# Patient Record
Sex: Female | Born: 1995 | Hispanic: Yes | Marital: Single | Smoking: Never smoker
Health system: Southern US, Community
[De-identification: ages and names within clinical notes are randomized; demographics above are authoritative.]

## PROBLEM LIST (undated history)

## (undated) DIAGNOSIS — I10 Essential (primary) hypertension: Secondary | ICD-10-CM

## (undated) DIAGNOSIS — J45909 Unspecified asthma, uncomplicated: Secondary | ICD-10-CM

## (undated) HISTORY — DX: Morbid (severe) obesity due to excess calories: E66.01

## (undated) HISTORY — PX: NO PAST SURGERIES: SHX2092

## (undated) HISTORY — DX: Unspecified asthma, uncomplicated: J45.909

## (undated) HISTORY — DX: Essential (primary) hypertension: I10

---

## 2014-02-25 NOTE — L&D Delivery Note (Cosign Needed)
Delivery Note At 11:42 AM a viable female was delivered via Vaginal, Spontaneous Delivery (Presentation: ; Occiput Anterior).  APGAR: 9, 9; weight 7 lb 15.7 oz (3620 g).   Placenta status: Intact, Spontaneous.  Cord: 3 vessels with the following complications: Short.  Cord pH: not obtained  Anesthesia: Epidural  Episiotomy: None Lacerations: 1st degree Suture Repair: 3.0 vicryl Est. Blood Loss (mL): 100  Mom to postpartum.  Baby to Couplet care / Skin to Skin.  Cherrie Gauze Wouk 10/15/2014, 1:59 PM

## 2014-03-10 ENCOUNTER — Other Ambulatory Visit (HOSPITAL_COMMUNITY): Payer: Self-pay | Admitting: Nurse Practitioner

## 2014-03-10 DIAGNOSIS — Z3689 Encounter for other specified antenatal screening: Secondary | ICD-10-CM

## 2014-03-10 LAB — OB RESULTS CONSOLE RPR: RPR: NONREACTIVE

## 2014-03-10 LAB — OB RESULTS CONSOLE ABO/RH: RH Type: POSITIVE

## 2014-03-10 LAB — OB RESULTS CONSOLE RUBELLA ANTIBODY, IGM: RUBELLA: IMMUNE

## 2014-03-10 LAB — OB RESULTS CONSOLE GC/CHLAMYDIA
Chlamydia: NEGATIVE
Gonorrhea: NEGATIVE

## 2014-03-10 LAB — OB RESULTS CONSOLE ANTIBODY SCREEN: Antibody Screen: NEGATIVE

## 2014-03-10 LAB — OB RESULTS CONSOLE HEPATITIS B SURFACE ANTIGEN: HEP B S AG: NEGATIVE

## 2014-03-10 LAB — OB RESULTS CONSOLE HIV ANTIBODY (ROUTINE TESTING): HIV: NONREACTIVE

## 2014-03-16 ENCOUNTER — Ambulatory Visit (HOSPITAL_COMMUNITY)
Admission: RE | Admit: 2014-03-16 | Discharge: 2014-03-16 | Disposition: A | Discharge status: Home / Self Care | Payer: Self-pay | Source: Ambulatory Visit | Attending: Nurse Practitioner | Admitting: Nurse Practitioner

## 2014-03-16 ENCOUNTER — Other Ambulatory Visit (HOSPITAL_COMMUNITY): Payer: Self-pay | Admitting: Nurse Practitioner

## 2014-03-16 ENCOUNTER — Encounter (HOSPITAL_COMMUNITY): Payer: Self-pay

## 2014-03-16 ENCOUNTER — Ambulatory Visit (HOSPITAL_COMMUNITY): Admission: RE | Admit: 2014-03-16 | Payer: Self-pay | Source: Ambulatory Visit

## 2014-03-16 DIAGNOSIS — Z3689 Encounter for other specified antenatal screening: Secondary | ICD-10-CM

## 2014-03-16 DIAGNOSIS — Z36 Encounter for antenatal screening of mother: Secondary | ICD-10-CM | POA: Insufficient documentation

## 2014-03-16 DIAGNOSIS — O208 Other hemorrhage in early pregnancy: Secondary | ICD-10-CM | POA: Insufficient documentation

## 2014-03-16 DIAGNOSIS — Z3A10 10 weeks gestation of pregnancy: Secondary | ICD-10-CM | POA: Insufficient documentation

## 2014-06-09 ENCOUNTER — Other Ambulatory Visit (HOSPITAL_COMMUNITY): Payer: Self-pay | Admitting: Urology

## 2014-06-09 DIAGNOSIS — Z36 Encounter for antenatal screening for chromosomal anomalies: Secondary | ICD-10-CM

## 2014-06-12 ENCOUNTER — Other Ambulatory Visit (HOSPITAL_COMMUNITY): Payer: Self-pay | Admitting: Urology

## 2014-06-12 DIAGNOSIS — Z3689 Encounter for other specified antenatal screening: Secondary | ICD-10-CM

## 2014-06-13 ENCOUNTER — Ambulatory Visit (HOSPITAL_COMMUNITY)
Admission: RE | Admit: 2014-06-13 | Discharge: 2014-06-13 | Disposition: A | Discharge status: Home / Self Care | Payer: Self-pay | Source: Ambulatory Visit | Attending: Urology | Admitting: Urology

## 2014-06-13 ENCOUNTER — Other Ambulatory Visit (HOSPITAL_COMMUNITY): Payer: Self-pay | Admitting: Urology

## 2014-06-13 DIAGNOSIS — Z3689 Encounter for other specified antenatal screening: Secondary | ICD-10-CM

## 2014-06-13 DIAGNOSIS — O99212 Obesity complicating pregnancy, second trimester: Secondary | ICD-10-CM | POA: Insufficient documentation

## 2014-06-13 DIAGNOSIS — Z36 Encounter for antenatal screening of mother: Secondary | ICD-10-CM | POA: Insufficient documentation

## 2014-06-13 DIAGNOSIS — Z3A22 22 weeks gestation of pregnancy: Secondary | ICD-10-CM | POA: Insufficient documentation

## 2014-06-16 ENCOUNTER — Emergency Department (HOSPITAL_COMMUNITY)
Admission: EM | Admit: 2014-06-16 | Discharge: 2014-06-16 | Disposition: A | Discharge status: Home / Self Care | Payer: Self-pay | Attending: Emergency Medicine | Admitting: Emergency Medicine

## 2014-06-16 ENCOUNTER — Encounter (HOSPITAL_COMMUNITY): Payer: Self-pay | Admitting: Emergency Medicine

## 2014-06-16 DIAGNOSIS — Z043 Encounter for examination and observation following other accident: Secondary | ICD-10-CM | POA: Insufficient documentation

## 2014-06-16 DIAGNOSIS — E669 Obesity, unspecified: Secondary | ICD-10-CM | POA: Insufficient documentation

## 2014-06-16 DIAGNOSIS — O9989 Other specified diseases and conditions complicating pregnancy, childbirth and the puerperium: Secondary | ICD-10-CM | POA: Insufficient documentation

## 2014-06-16 DIAGNOSIS — Y9389 Activity, other specified: Secondary | ICD-10-CM | POA: Insufficient documentation

## 2014-06-16 DIAGNOSIS — Z3A23 23 weeks gestation of pregnancy: Secondary | ICD-10-CM | POA: Insufficient documentation

## 2014-06-16 DIAGNOSIS — O99212 Obesity complicating pregnancy, second trimester: Secondary | ICD-10-CM | POA: Insufficient documentation

## 2014-06-16 DIAGNOSIS — W1839XA Other fall on same level, initial encounter: Secondary | ICD-10-CM | POA: Insufficient documentation

## 2014-06-16 DIAGNOSIS — R55 Syncope and collapse: Secondary | ICD-10-CM | POA: Insufficient documentation

## 2014-06-16 DIAGNOSIS — Y998 Other external cause status: Secondary | ICD-10-CM | POA: Insufficient documentation

## 2014-06-16 DIAGNOSIS — Z79899 Other long term (current) drug therapy: Secondary | ICD-10-CM | POA: Insufficient documentation

## 2014-06-16 LAB — CBC WITH DIFFERENTIAL/PLATELET
Basophils Absolute: 0 10*3/uL (ref 0.0–0.1)
Basophils Relative: 0 % (ref 0–1)
EOS PCT: 1 % (ref 0–5)
Eosinophils Absolute: 0.1 10*3/uL (ref 0.0–0.7)
HCT: 34.1 % — ABNORMAL LOW (ref 36.0–46.0)
Hemoglobin: 11.5 g/dL — ABNORMAL LOW (ref 12.0–15.0)
LYMPHS ABS: 1.4 10*3/uL (ref 0.7–4.0)
Lymphocytes Relative: 14 % (ref 12–46)
MCH: 28.3 pg (ref 26.0–34.0)
MCHC: 33.7 g/dL (ref 30.0–36.0)
MCV: 84 fL (ref 78.0–100.0)
Monocytes Absolute: 0.6 10*3/uL (ref 0.1–1.0)
Monocytes Relative: 6 % (ref 3–12)
NEUTROS PCT: 79 % — AB (ref 43–77)
Neutro Abs: 7.6 10*3/uL (ref 1.7–7.7)
Platelets: 215 10*3/uL (ref 150–400)
RBC: 4.06 MIL/uL (ref 3.87–5.11)
RDW: 13.6 % (ref 11.5–15.5)
WBC: 9.7 10*3/uL (ref 4.0–10.5)

## 2014-06-16 LAB — BASIC METABOLIC PANEL
Anion gap: 9 (ref 5–15)
BUN: 6 mg/dL (ref 6–23)
CALCIUM: 9.2 mg/dL (ref 8.4–10.5)
CO2: 23 mmol/L (ref 19–32)
Chloride: 107 mmol/L (ref 96–112)
Creatinine, Ser: 0.62 mg/dL (ref 0.50–1.10)
GFR calc Af Amer: >90 mL/min (ref >90)
GFR calc non Af Amer: >90 mL/min (ref >90)
Glucose, Bld: 87 mg/dL (ref 70–99)
POTASSIUM: 3.7 mmol/L (ref 3.5–5.1)
SODIUM: 139 mmol/L (ref 135–145)

## 2014-06-16 LAB — CBG MONITORING, ED: Glucose-Capillary: 85 mg/dL (ref 70–99)

## 2014-06-16 NOTE — Discharge Instructions (Signed)
Presncope (Near-Syncope) El presncope (comnmente llamado "casi desmayo") es un estado de debilidad repentina, Research scientist (life sciences)mareos o sensacin de que la persona va a Baristadesmayarse. Durante un episodio de presncope, tambin puede tener la piel plida, visin en tnel o ganas de vomitar (nuseas). El presncope puede ocurrir al levantarse de una silla o al Personal assistantpermanecer de pie durante Chase Citymucho tiempo. La causa es una disminucin sbita del flujo de sangre al cerebro. Esta disminucin puede ser el resultado de varias causas o disparadores, la Churubuscomayora de las cuales no son graves. Sin embargo, debido a que a Multimedia programmerveces el presncope puede ser un signo de una afeccin grave, es necesaria una evaluacin mdica. Generalmente la causa especfica no puede determinarse. INSTRUCCIONES PARA EL CUIDADO EN EL HOGAR  Controle su afeccin para ver si hay cambios. Las siguientes indicaciones ayudarn a Psychologist, educationalaliviar cualquier Longs Drug Storesmolestia que pueda sentir:  Pdale a alguien que se quede con usted hasta que se sienta estable.  Recustese inmediatamente y VF Corporationeleve las piernas si siente que va a desmayarse. Respire profundamente y de Williamsfieldmanera continua. Espere hasta que los sntomas hayan desaparecido. La mayor parte de los episodios dura unos pocos minutos. Podr sentirse cansado por varias horas.  Beba suficiente lquido para Photographermantener la orina clara o de color amarillo plido.  Si toma medicamentos para la presin arterial o para el corazn, levntese lentamente si est sentado o acostado. Tmese algunos minutos para permanecer sentado y luego prese. Esto puede reducir Microsoftlos mareos.  Concurra a las consultas de control con su mdico segn las indicaciones. SOLICITE ATENCIN MDICA DE INMEDIATO SI:   Sufre un dolor intenso de Turkmenistancabeza.  Siente un dolor intenso inusual en el pecho, el abdomen o la espalda.  Tiene un sangrado por la boca o el recto, o la materia fecal es de color negro o aspecto alquitranado.  Siente latidos irregulares o muy  rpidos.  Sufre episodios de Baxter Internationaldesmayo repetidos o temblores como sacudidas durante un episodio.  Se desmaya mientras se encuentra sentado o acostado.  Se siente confundido.  Tiene problemas para caminar.  Siente debilidad intensa.  Tiene problemas de visin. ASEGRESE DE QUE:   Comprende estas instrucciones.  Controlar su afeccin.  Recibir ayuda de inmediato si no mejora o si empeora. Document Released: 02/11/2005 Document Revised: 02/16/2013 [facility] Patient Information 2015 [place]manExitCare, MarylandLLC. This information is not intended to replace advice given to you by your health care provider. Make sure you discuss any questions you have with your health care provider.  Sncope vasovagal - Adultos (Vasovagal Syncope, Adult) El sncope, comnmente conocido como Shady Shoresdesmayo, es una prdida transitoria de la conciencia. Se produce cuando se reduce el flujo sanguneo al cerebro. El sncope vasovagal (tambin llamado sncope neurocardiognico) es un desmayo en el que el flujo de sangre al cerebro se reduce a causa de una cada repentina en la frecuencia cardaca y la presin arterial. El sncope vasovagal se produce cuando el cerebro y el sistema cardiovascular (vasos sanguneos) no se comunican ni responden el uno al otro de Greenvalemanera adecuada. Esta es la causa ms comn de Worthingtondesmayos. A menudo se produce como respuesta al miedo o algn otro tipo de estrs emocional o fsico. El cuerpo tiene una reaccin en la que el corazn comienza a latir muy lentamente o los vasos sanguneos se expanden, para reducir la presin arterial. Este tipo de desmayo se considera generalmente inofensivo. Sin embargo, pueden ocurrir lesiones si la persona tiene una cada repentina durante el Coulee Damdesmayo.  CAUSAS  El sncope vasovagal se produce cuando la  presin arterial y la frecuencia cardaca de una persona disminuyen de repente, por lo general en respuesta a un factor desencadenante. Muchas cosas y situaciones pueden desencadenar  un episodio. Entre ellas se incluyen:   Dolor.   Temor.   Ver sangre o procedimientos mdicos, como la sangre que se extrae de una vena.   Las [employer], tales como toser, Financial controller, o ir al bao.   Estrs emocional.   Permanecer mucho tiempo de pie, especialmente en un ambiente clido.   La falta de sueo o de descanso.   Prolongada falta de alimentos.   Prolongada falta de lquidos.   Enfermedad reciente.  El uso de ciertas drogas que afectan la presin arterial, como la cocana, el alcohol, la marihuana, los inhalantes y los opiceos.  SNTOMAS  Antes del episodio de desmayo, es posible que:   Se sienta mareado o dbil.   Se vuelve plido.  Sienta que se va a desmayar.   Siente como si la habitacin diera vueltas.   Tiene una visin de tnel, slo ve lo que hay directamente frente a usted.   Siente malestar estomacal (nuseas).   Ve manchas o pierde poco a poco la visin.   Escucha un zumbido en los odos.   Tiene dolor de [place].  Se siente afiebrado y sudoroso.   Tiene sensacin de hinchazn u hormigueo. Durante el desmayo, por lo general, estar inconsciente durante no ms de un par de minutos antes de despertar y volver a la normalidad. Si usted se levanta demasiado rpido antes de que su cuerpo pueda recuperarse, se va a desmayar otra vez. Durante el desmayo pueden producirse movimientos espasmdicos o bruscos.  DIAGNSTICO  El mdico le preguntar United Stationers sntomas y le har una historia clnica y un examen fsico. Se pueden hacer varios estudios para Sales promotion account executive otras causas de los [place]. Estos pueden incluir anlisis de [place] y pruebas para revisar el corazn, como un Midwife, y posiblemente, un estudio de Actor. Cuando otras causas han sido descartadas, se puede Education officer, environmental un examen para comprobar la respuesta del organismo a los cambios de posicin (prueba de la mesa basculante).   TRATAMIENTO  La mayora de los casos de sncope vasovagal no requieren TEFL teacher. Su mdico le puede Financial planner de Automotive engineer los desencadenantes de [place] y puede proporcionarle estrategias caseras para prevenir desmayos. Si tiene que estar expuesto a un posible desencadenante, puede beber lquidos adicionales para ayudar a reducir sus probabilidades de sufrir un episodio de sncope vasovagal. Si hay signos de advertencia de un episodio que se acerca, usted puede responder posicionndose favorablemente (acostarse).  Si los [employer], se le pueden dar medicamentos para evitarlos. Algunos medicamentos pueden ayudar a hacerlo ms resistente a episodios repetidos de sncope vasovagal. Se pueden recomendar ejercicios especiales o medias de compresin. En raros casos, se considera la colocacin quirrgica de un marcapasos.  INSTRUCCIONES PARA EL CUIDADO DOMICILIARIO   Aprenda a identificar las seales del sncope vasovagal.   Sintese o acustese a la primera seal de un desmayo. Si se sienta, ponga su cabeza [employer]. Si usted se [place], coloque las piernas [place] para aumentar el flujo de sangre al cerebro.   Evite los baos calientes y los saunas.  Evite estar mucho tiempo de pie.  Debe ingerir gran cantidad de lquido para mantener la orina de tono claro o color amarillo plido. Evite la cafena.  Aumente la sal en su dieta siguiendo las indicaciones de su mdico.   Si tiene que estar  parado durante mucho tiempo, realice movimientos tales como:   Psychologist, counselling las piernas.   Flexionar y Furniture conservator/restorer los msculos de las piernas.   Ponerse en cuclillas.   Mover las piernas.   Doblarse.   Tome slo medicamentos de venta libre o recetados, segn las indicaciones del mdico. No deje de tomar ningn medicamento sin consultar con su mdico primero. SOLICITE ATENCIN MDICA SI:   Sus desmayos continan o suceden con mayor frecuencia a pesar del tratamiento.    Pierde el conocimiento durante ms de un par de minutos.  Se ha desmayado durante o despus de hacer ejercicio o si se sorprende.   Usted tiene nuevos sntomas que se producen con los [place], tales como:   Falta de [place].  Dolor en el pecho.   Latidos cardacos irregulares.   Usted tiene episodios de espasmos o movimientos bruscos que duran ms de unos pocos segundos.  Usted tiene episodios de espasmos o movimientos bruscos sin desmayos. BUSQUE ATENCIN MDICA DE INMEDIATO SI:   Usted tiene lesiones o hemorragia despus de un desmayo.   Usted tiene episodios de espasmos o movimientos bruscos que duran ms de 5 minutos.   Usted tiene ms de un episodio de movimientos espasmdicos antes de Copy la consciencia despus de un desmayo. ASEGRESE DE QUE:   Comprende estas instrucciones.  Controlar su enfermedad.  Solicitar ayuda de inmediato si no mejora o si empeora. Document Released: 06/08/2012 [facility] Patient Information 2015 [place], [place]. This information is not intended to replace advice given to you by your health care provider. Make sure you discuss any questions you have with your health care provider. Please follow-up with your doctor for further evaluation and management of your symptoms. There is not appear to be an emergent cause for your symptoms to experience today. Return to ED for new or worsening symptoms.

## 2014-06-16 NOTE — ED Provider Notes (Signed)
CSN: [id number]     Arrival date & time 06/16/14  1121 History   First MD Initiated Contact with Patient 06/16/14 1130     Chief Complaint  Patient presents with  . Loss of Consciousness     (Consider location/radiation/quality/duration/timing/severity/associated sxs/prior Treatment) HPI Sabrina Riley is a 19 y.o. female who is currently [redacted] weeks pregnant who comes in for evaluation of her fall. Patient is Spanish-speaking and uses family at the bedside to interpret. Family reports patient was visiting another patient in the ICU when she became hot, felt dizzy and fainted falling forward. Family reports that she was standing beside patient when this occurred, patient appeared to have lost consciousness "for a second" but did not hit her head. Denies any headache, vision changes, chest pain, short of breath, abdominal pain, pelvic pain, vaginal bleeding or discharge. Patient denies any discomfort at this time. Rapid OB at bedside.  History reviewed. No pertinent past medical history. History reviewed. No pertinent past surgical history. History reviewed. No pertinent family history. History  Substance Use Topics  . Smoking status: Never Smoker   . Smokeless tobacco: Never Used  . Alcohol Use: No   OB History    Gravida Para Term Preterm AB TAB SAB Ectopic Multiple Living   1              Review of Systems A 10 point review of systems was completed and was negative except for pertinent positives and negatives as mentioned in the history of present illness     Allergies  Review of patient's allergies indicates no known allergies.  Home Medications   Prior to Admission medications   Medication Sig Start Date End Date Taking? Authorizing Provider  ibuprofen (ADVIL,MOTRIN) 200 MG tablet Take 200 mg by mouth every 6 (six) hours as needed for headache.   Yes Historical Provider, MD  Prenatal Vit-Fe Fumarate-FA (PRENATAL MULTIVITAMIN) TABS tablet Take 1 tablet by mouth daily at  12 noon.   Yes Historical Provider, MD   BP 125/68 mmHg  Pulse 84  Temp(Src) 98.5 F (36.9 C) (Oral)  Resp 21  Ht 5\' 6"  (1.676 m)  Wt 286 lb (129.729 kg)  BMI 46.18 kg/m2  SpO2 99% Physical Exam  Constitutional: She is oriented to person, place, and time. She appears well-developed and well-nourished.  Obese  HENT:  Head: Normocephalic and atraumatic.  Mouth/Throat: Oropharynx is clear and moist.  Eyes: Conjunctivae are normal. Pupils are equal, round, and reactive to light. Right eye exhibits no discharge. Left eye exhibits no discharge. No scleral icterus.  Neck: Neck supple.  Cardiovascular: Normal rate, regular rhythm and normal heart sounds.   Pulmonary/Chest: Effort normal and breath sounds normal. No respiratory distress. She has no wheezes. She has no rales.  Abdominal: Soft. There is no tenderness.  Musculoskeletal: She exhibits no tenderness.  Neurological: She is alert and oriented to person, place, and time.  Cranial Nerves II-XII grossly intact. Moves all extremities without ataxia. Baseline mental status. Sensation intact. Gait Baseline  Skin: Skin is warm and dry. No rash noted.  Psychiatric: She has a normal mood and affect.  Nursing note and vitals reviewed.   ED Course  Procedures (including critical care time) Labs Review Labs Reviewed  CBC WITH DIFFERENTIAL/PLATELET - Abnormal; Notable for the following:    Hemoglobin 11.5 (*)    HCT 34.1 (*)    Neutrophils Relative % 79 (*)    All other components within normal limits  BASIC METABOLIC PANEL  CBG  MONITORING, ED    Imaging Review No results found.   EKG Interpretation   Date/Time:  Thursday June 16 2014 12:41:14 EDT Ventricular Rate:  72 PR Interval:  124 QRS Duration: 94 QT Interval:  395 QTC Calculation: 432 R Axis:   60 Text Interpretation:  Sinus rhythm Baseline wander in lead(s) I Confirmed  by HARRISON  MD, FORREST ([id number]) on 06/16/2014 12:43:50 PM      Meds given in  ED:  Medications - No data to display  Discharge Medication List as of 06/16/2014  2:54 PM     Filed Vitals:   06/16/14 1245 06/16/14 1345 06/16/14 1415 06/16/14 1458  BP: 116/62 128/59 131/85 125/68  Pulse: 70 73 115 84  Temp:    98.5 F (36.9 C)  TempSrc:    Oral  Resp: Height:      Weight:      SpO2: 99% 100% 99% 99%    MDM  Vitals stable - WNL -afebrile Pt resting comfortably in ED. PE--grossly benign physical exam. Patient ambulates throughout the ED without difficulty. Labwork--labs are noncontributory.  DDX--per OB note, patient is cleared from obstetric standpoint. Patient is also clear from medical standpoint. No evidence of acute or emergent pathology. Doubt ICH, SAH or other intracranial pathology. Reported syncopal event does not appear to be cardiac in nature. [place] syncope negative. Discussed further follow-up with primary care for further evaluation and management of symptoms. Discussed further the need for appropriate rehydration therapies.  I discussed all relevant lab findings and imaging results with pt and they verbalized understanding. Discussed f/u with PCP within 48 hrs and return precautions, pt very amenable to plan. Prior to patient discharge, I discussed and reviewed this case with Dr. Romeo Apple   Final diagnoses:  Syncope, non cardiac        Joycie Peek, PA-C 06/16/14 2108  Purvis Sheffield, MD 06/16/14 2114

## 2014-06-16 NOTE — Progress Notes (Signed)
1140 Arrived to evaluate this 19 yo G1P0 @ 23.[redacted] wks GA in with report of syncopal episode with fall.  No trauma noted.  No abdominal pain, vaginal bleeding or leaking of fluid.  Reports good fetal movement.  1150  Bedside US by EDP to determine presentation and locate fetal heart to assist with EFM placement.  FHR WNL.  1230 Dr. Adriano Bischof [facility] notified of patient in ED and of complaint.  Notified of recent US with dating.  Notified of FHR WNL without report of UC's.  With no indication of abdominal trauma patient is OB cleared by Dr. Mahagony Grieb FullingHarraway-Smith.

## 2014-06-16 NOTE — ED Notes (Signed)
Pt coming in from visiting pt in the ICU. Pt had an unwitnessed fall. Pt reports that she became hot and dizzy. Pt reports that when she fell she caught herself with her hands. Pt is [redacted] weeks pregnant. Rapid Ab nurse called. Pt denies all pain.

## 2014-09-21 LAB — OB RESULTS CONSOLE GBS: GBS: POSITIVE

## 2014-10-12 ENCOUNTER — Other Ambulatory Visit (HOSPITAL_COMMUNITY): Payer: Self-pay | Admitting: Urology

## 2014-10-12 ENCOUNTER — Ambulatory Visit (HOSPITAL_COMMUNITY)
Admission: RE | Admit: 2014-10-12 | Discharge: 2014-10-12 | Disposition: A | Discharge status: Home / Self Care | Payer: Self-pay | Source: Ambulatory Visit | Attending: Physician Assistant | Admitting: Physician Assistant

## 2014-10-12 DIAGNOSIS — IMO0001 Reserved for inherently not codable concepts without codable children: Secondary | ICD-10-CM

## 2014-10-12 DIAGNOSIS — Z3403 Encounter for supervision of normal first pregnancy, third trimester: Secondary | ICD-10-CM

## 2014-10-12 DIAGNOSIS — O480 Post-term pregnancy: Secondary | ICD-10-CM

## 2014-10-13 ENCOUNTER — Encounter (HOSPITAL_COMMUNITY): Payer: Self-pay | Admitting: *Deleted

## 2014-10-13 ENCOUNTER — Telehealth (HOSPITAL_COMMUNITY): Payer: Self-pay | Admitting: *Deleted

## 2014-10-13 NOTE — Telephone Encounter (Signed)
Preadmission screen Interpreter number [id number] 

## 2014-10-14 ENCOUNTER — Inpatient Hospital Stay (HOSPITAL_COMMUNITY)
Admission: AD | Admit: 2014-10-14 | Discharge: 2014-10-17 | DRG: 775 | Disposition: A | Discharge status: Home / Self Care | Payer: Medicaid Other | Source: Ambulatory Visit | Attending: Family Medicine | Admitting: Family Medicine

## 2014-10-14 ENCOUNTER — Encounter (HOSPITAL_COMMUNITY): Payer: Self-pay | Admitting: *Deleted

## 2014-10-14 DIAGNOSIS — Z8759 Personal history of other complications of pregnancy, childbirth and the puerperium: Secondary | ICD-10-CM

## 2014-10-14 DIAGNOSIS — Z3A40 40 weeks gestation of pregnancy: Secondary | ICD-10-CM | POA: Diagnosis present

## 2014-10-14 DIAGNOSIS — O133 Gestational [pregnancy-induced] hypertension without significant proteinuria, third trimester: Secondary | ICD-10-CM

## 2014-10-14 DIAGNOSIS — O99212 Obesity complicating pregnancy, second trimester: Secondary | ICD-10-CM | POA: Diagnosis present

## 2014-10-14 DIAGNOSIS — O99824 Streptococcus B carrier state complicating childbirth: Secondary | ICD-10-CM | POA: Diagnosis present

## 2014-10-14 DIAGNOSIS — O480 Post-term pregnancy: Secondary | ICD-10-CM | POA: Diagnosis present

## 2014-10-14 DIAGNOSIS — O99214 Obesity complicating childbirth: Secondary | ICD-10-CM | POA: Diagnosis present

## 2014-10-14 DIAGNOSIS — Z6841 Body Mass Index (BMI) 40.0 and over, adult: Secondary | ICD-10-CM | POA: Diagnosis not present

## 2014-10-14 DIAGNOSIS — Z3490 Encounter for supervision of normal pregnancy, unspecified, unspecified trimester: Secondary | ICD-10-CM

## 2014-10-14 HISTORY — DX: Personal history of other complications of pregnancy, childbirth and the puerperium: Z87.59

## 2014-10-14 LAB — PROTEIN / CREATININE RATIO, URINE: Creatinine, Urine: 67 mg/dL

## 2014-10-14 LAB — CBC
HEMATOCRIT: 38.1 % (ref 36.0–46.0)
HEMOGLOBIN: 13.4 g/dL (ref 12.0–15.0)
MCH: 29.5 pg (ref 26.0–34.0)
MCHC: 35.2 g/dL (ref 30.0–36.0)
MCV: 83.7 fL (ref 78.0–100.0)
Platelets: 192 10*3/uL (ref 150–400)
RBC: 4.55 MIL/uL (ref 3.87–5.11)
RDW: 14.4 % (ref 11.5–15.5)
WBC: 9.2 10*3/uL (ref 4.0–10.5)

## 2014-10-14 LAB — TYPE AND SCREEN
ABO/RH(D): O POS
Antibody Screen: NEGATIVE

## 2014-10-14 LAB — URINE MICROSCOPIC-ADD ON

## 2014-10-14 LAB — URINALYSIS, ROUTINE W REFLEX MICROSCOPIC
Bilirubin Urine: NEGATIVE
Glucose, UA: NEGATIVE mg/dL
Hgb urine dipstick: NEGATIVE
Ketones, ur: NEGATIVE mg/dL
Nitrite: NEGATIVE
Protein, ur: NEGATIVE mg/dL
Specific Gravity, Urine: 1.01 (ref 1.005–1.030)
Urobilinogen, UA: 0.2 mg/dL (ref 0.0–1.0)
pH: 5.5 (ref 5.0–8.0)

## 2014-10-14 LAB — COMPREHENSIVE METABOLIC PANEL WITH GFR
ALT: 21 U/L (ref 14–54)
AST: 24 U/L (ref 15–41)
Albumin: 2.9 g/dL — ABNORMAL LOW (ref 3.5–5.0)
Alkaline Phosphatase: 138 U/L — ABNORMAL HIGH (ref 38–126)
Anion gap: 11 (ref 5–15)
BUN: 7 mg/dL (ref 6–20)
CO2: 20 mmol/L — ABNORMAL LOW (ref 22–32)
Calcium: 9.3 mg/dL (ref 8.9–10.3)
Chloride: 106 mmol/L (ref 101–111)
Creatinine, Ser: 0.55 mg/dL (ref 0.44–1.00)
GFR calc Af Amer: >60 mL/min
GFR calc non Af Amer: >60 mL/min
Glucose, Bld: 77 mg/dL (ref 65–99)
Potassium: 4 mmol/L (ref 3.5–5.1)
Sodium: 137 mmol/L (ref 135–145)
Total Bilirubin: 0.5 mg/dL (ref 0.3–1.2)
Total Protein: 6.2 g/dL — ABNORMAL LOW (ref 6.5–8.1)

## 2014-10-14 LAB — ABO/RH: ABO/RH(D): O POS

## 2014-10-14 MED ORDER — MISOPROSTOL 25 MCG QUARTER TABLET
25.0000 ug | ORAL_TABLET | ORAL | Status: DC | PRN
  Administered 2014-10-14 (×2): 25 ug via VAGINAL
  Filled 2014-10-14 (×2): qty 0.25

## 2014-10-14 MED ORDER — OXYCODONE-ACETAMINOPHEN 5-325 MG PO TABS: 1.0000 | ORAL_TABLET | ORAL | Status: DC | PRN

## 2014-10-14 MED ORDER — LIDOCAINE HCL (PF) 1 % IJ SOLN
30.0000 mL | INTRAMUSCULAR | Status: DC | PRN
  Filled 2014-10-14: qty 30

## 2014-10-14 MED ORDER — ONDANSETRON HCL 4 MG/2ML IJ SOLN: 4.0000 mg | Freq: Four times a day (QID) | INTRAMUSCULAR | Status: DC | PRN

## 2014-10-14 MED ORDER — PENICILLIN G POTASSIUM 5000000 UNITS IJ SOLR
5.0000 10*6.[IU] | Freq: Once | INTRAVENOUS | Status: AC
  Administered 2014-10-14: 5 10*6.[IU] via INTRAVENOUS
  Filled 2014-10-14: qty 5

## 2014-10-14 MED ORDER — CITRIC ACID-SODIUM CITRATE 334-500 MG/5ML PO SOLN: 30.0000 mL | ORAL | Status: DC | PRN

## 2014-10-14 MED ORDER — LACTATED RINGERS IV SOLN: 500.0000 mL | INTRAVENOUS | Status: DC | PRN

## 2014-10-14 MED ORDER — PENICILLIN G POTASSIUM 5000000 UNITS IJ SOLR
2.5000 10*6.[IU] | INTRAVENOUS | Status: DC
  Administered 2014-10-15 (×2): 2.5 10*6.[IU] via INTRAVENOUS
  Filled 2014-10-14 (×7): qty 2.5

## 2014-10-14 MED ORDER — LACTATED RINGERS IV SOLN
INTRAVENOUS | Status: DC
  Administered 2014-10-14: 125 mL/h via INTRAVENOUS
  Administered 2014-10-14 – 2014-10-15 (×2): via INTRAVENOUS

## 2014-10-14 MED ORDER — PENICILLIN G POTASSIUM 5000000 UNITS IJ SOLR
5.0000 10*6.[IU] | Freq: Once | INTRAVENOUS | Status: DC
  Filled 2014-10-14: qty 5

## 2014-10-14 MED ORDER — FENTANYL CITRATE (PF) 100 MCG/2ML IJ SOLN
50.0000 ug | INTRAMUSCULAR | Status: DC | PRN
  Administered 2014-10-15 (×3): 100 ug via INTRAVENOUS
  Administered 2014-10-15: 50 ug via INTRAVENOUS
  Filled 2014-10-14 (×4): qty 2

## 2014-10-14 MED ORDER — PENICILLIN G POTASSIUM 5000000 UNITS IJ SOLR
2.5000 10*6.[IU] | INTRAVENOUS | Status: DC
  Filled 2014-10-14 (×4): qty 2.5

## 2014-10-14 MED ORDER — OXYTOCIN 40 UNITS IN LACTATED RINGERS INFUSION - SIMPLE MED
1.0000 m[IU]/min | INTRAVENOUS | Status: DC
  Administered 2014-10-14: 2 m[IU]/min via INTRAVENOUS
  Filled 2014-10-14 (×2): qty 1000

## 2014-10-14 MED ORDER — OXYCODONE-ACETAMINOPHEN 5-325 MG PO TABS: 2.0000 | ORAL_TABLET | ORAL | Status: DC | PRN

## 2014-10-14 MED ORDER — ACETAMINOPHEN 325 MG PO TABS: 650.0000 mg | ORAL_TABLET | ORAL | Status: DC | PRN

## 2014-10-14 MED ORDER — TERBUTALINE SULFATE 1 MG/ML IJ SOLN: 0.2500 mg | Freq: Once | INTRAMUSCULAR | Status: DC | PRN

## 2014-10-14 NOTE — MAU Note (Signed)
Pt sent from [facility] for HTN eval. BP 150/80 in office today. Spanish interpreter needed.

## 2014-10-14 NOTE — Progress Notes (Signed)
Labor Progress Note  S: Feeling well. No complaints.   O:  BP 131/63 mmHg  Pulse 68  Temp(Src) 98.1 F (36.7 C) (Axillary)  Resp 16  Ht 5' 2.25" (1.581 m)  Wt 303 lb 4 oz (137.553 kg)  BMI 55.03 kg/m2  Dilation: 1.5 Effacement (%): 70 Cervical Position: Posterior Station: -3 Presentation: Vertex Exam by:: Dr.Layanna Charo  A&P: 19 y.o. G1P0 [redacted]w[redacted]d for IOL 2/2 to gHTN #Labor: Induction. S/p Cytotec #2 (placed dose#2 now), FB placed #FWB: CAT I #GBS POS- PCN in labor or with ROM  Federico Flake, MD 6:37 PM

## 2014-10-14 NOTE — Plan of Care (Signed)
Pt. Urine in lab 

## 2014-10-14 NOTE — Progress Notes (Signed)
Labor Progress Note  Sabrina Riley is a 19 y.o. G1P0 at [redacted]w[redacted]d  admitted for induction of labor due to Gestational Hypertension.  S: Patient doing well and comfortable. No concerns voiced.  O:  BP 114/98 mmHg  Pulse 72  Temp(Src) 99 F (37.2 C) (Oral)  Resp 16  Ht 5' 2.25" (1.581 m)  Wt 303 lb 4 oz (137.553 kg)  BMI 55.03 kg/m2  FHT:  FHR: 140 bpm, variability: moderate,  accelerations:  Present,  decelerations:  Absent UC:   regular, every 3-6 minutes SVE:   Dilation: 4 Effacement (%): 90 Station: -3 Exam by:: CNM Leftwich-kirby  Labs: Lab Results  Component Value Date   WBC 9.2 10/14/2014   HGB 13.4 10/14/2014   HCT 38.1 10/14/2014   MCV 83.7 10/14/2014   PLT 192 10/14/2014    Assessment / Plan: 19 y.o. G1P0 [redacted]w[redacted]d  in early labor Induction of labor due to gestational hypertension  Labor: Will start pitocin; FB now out.  Fetal Wellbeing:  Category I Pain Control:  IV pain medications upon maternal request; she declined epidural Anticipated MOD:  NSVD  Expectant management   Caryl Ada, DO 10/14/2014, 11:29 PM PGY-2, [facility]

## 2014-10-14 NOTE — H&P (Signed)
OBSTETRIC ADMISSION HISTORY AND PHYSICAL  Sabrina Riley is a 19 y.o. female G1P0 with IUP at [redacted]w[redacted]d by 10 week [place] presenting for elevated BP at term.  She has been seen in clinic with two blood pressures > 140/90 more than 6 hours apartment. Previous CMP, CBC were wnl per review of records.   She reports +FMs, No LOF, no VB, no blurry vision, headaches or peripheral edema, and RUQ pain.    She plans on breastfeeding. She requests IUD for birth control.  Dating: By Ines Bloomer --->  Estimated Date of Delivery: 10/12/14  Sono:   @[redacted]w[redacted]d  BPP, Cephalic presentation. AFI =14.36 cm, BPP 8/8 @22w6 , Anatomy Scan. 535g, 53rd%, anterior placenta  Prenatal History/Complications: Past Medical History: Past Medical History  Diagnosis Date  . Hypertension   . Asthma   . Morbid obesity     Past Surgical History: Past Surgical History  Procedure Laterality Date  . No past surgeries      Obstetrical History: OB History    Gravida Para Term Preterm AB TAB SAB Ectopic Multiple Living   1               Social History: Social History   Social History  . Marital Status: Single    Spouse Name: N/A  . Number of Children: N/A  . Years of Education: N/A   Social History Main Topics  . Smoking status: Never Smoker   . Smokeless tobacco: Never Used  . Alcohol Use: No  . Drug Use: No  . Sexual Activity: Yes   Other Topics Concern  . None   Social History Narrative    Family History: Family History  Problem Relation Age of Onset  . Heart disease Father   . Diabetes Father   . Thyroid disease Sister     Allergies: No Known Allergies  Prescriptions prior to admission  Medication Sig Dispense Refill Last Dose  . Prenatal Vit-Fe Fumarate-FA (PRENATAL MULTIVITAMIN) TABS tablet Take 1 tablet by mouth daily at 12 noon.   10/14/2014 at Unknown time     Review of Systems   All systems reviewed and negative except as stated in HPI  Blood pressure 120/71, pulse 74, temperature 98.3  F (36.8 C), temperature source Oral, resp. rate 18, height 5' 2.25" (1.581 m), weight 303 lb 4 oz (137.553 kg). General appearance: alert, cooperative and morbidly obese Lungs: clear to auscultation bilaterally Heart: regular rate and rhythm Abdomen: soft, non-tender; bowel sounds normal Pelvic: adequate Extremities: Homans sign is negative, no sign of DVT Presentation: cephalic Fetal monitoringBaseline: 135 bpm/ Mod/ +Accels, no decels Uterine activity: None Dilation: 1 Effacement (%): 80 Station: -3 Exam by:: Tiffinie Caillier MD   Prenatal labs: ABO, Rh: O/Positive/-- (01/14 0000) Antibody: Negative (01/14 0000) Rubella:  IMMUNE (03/10/2014) RPR: Nonreactive (01/14 0000)  HBsAg: Negative (01/14 0000)  HIV: Non-reactive (01/14 0000)  GBS: Positive (07/27 0000)  1 hr Glucola  107 Genetic screening  Neg Anatomy US - wnl  Prenatal Transfer Tool  Maternal Diabetes: No Genetic Screening: Normal Maternal Ultrasounds/Referrals: Normal Fetal Ultrasounds or other Referrals:  None Maternal Substance Abuse:  No Significant Maternal Medications:  None Significant Maternal Lab Results: Lab values include: Group B Strep positive  Results for orders placed or performed during the hospital encounter of 10/14/14 (from the past 24 hour(s))  Protein / creatinine ratio, urine   Collection Time: 10/14/14 10:42 AM  Result Value Ref Range   Creatinine, Urine 67.00 mg/dL   Total Protein, Urine PENDING mg/dL  Protein Creatinine Ratio PENDING 0.00 - 0.15 mg/mg[Cre]    Patient Active Problem List   Diagnosis Date Noted  . Term pregnancy 10/14/2014  . Gestational HTN 10/14/2014  . Obesity affecting pregnancy in second trimester, antepartum     Assessment: Sabrina Riley is a 19 y.o. G1P0 at [redacted]w[redacted]d here for elevated BP that necessitates IOL given meeting criteria for gHTN at term.   #Labor: Induction 2/2 to gHTN at term.  Plan for cytotec and FB placement. Pit when 4cm.  #Pain: Does not  want epidural. Would like medication in IV #FWB: Cat I  #ID: GBS POS- PCN with active labor or ROM #MOF: Breast #MOC:IUD  #gHTN vs PreX:  Will get labs to help clarify diagnosis but regardless necessitates IOL.   Sabrina Riley [facility] 10/14/2014, 12:32 PM

## 2014-10-14 NOTE — Progress Notes (Signed)
I stopped to check on patient's need I ordered some snacks, by Orlan Leavens Spanish Interpreter.

## 2014-10-15 ENCOUNTER — Inpatient Hospital Stay (HOSPITAL_COMMUNITY): Payer: Medicaid Other | Admitting: Anesthesiology

## 2014-10-15 ENCOUNTER — Encounter (HOSPITAL_COMMUNITY): Payer: Self-pay | Admitting: *Deleted

## 2014-10-15 DIAGNOSIS — O480 Post-term pregnancy: Secondary | ICD-10-CM

## 2014-10-15 DIAGNOSIS — Z3A40 40 weeks gestation of pregnancy: Secondary | ICD-10-CM

## 2014-10-15 DIAGNOSIS — O99824 Streptococcus B carrier state complicating childbirth: Secondary | ICD-10-CM

## 2014-10-15 DIAGNOSIS — Z6841 Body Mass Index (BMI) 40.0 and over, adult: Secondary | ICD-10-CM

## 2014-10-15 DIAGNOSIS — O99214 Obesity complicating childbirth: Secondary | ICD-10-CM

## 2014-10-15 DIAGNOSIS — O133 Gestational [pregnancy-induced] hypertension without significant proteinuria, third trimester: Secondary | ICD-10-CM

## 2014-10-15 LAB — CBC
HCT: 39.5 % (ref 36.0–46.0)
HEMATOCRIT: 38.1 % (ref 36.0–46.0)
HEMOGLOBIN: 13.9 g/dL (ref 12.0–15.0)
Hemoglobin: 13.3 g/dL (ref 12.0–15.0)
MCH: 29.5 pg (ref 26.0–34.0)
MCH: 29.3 pg (ref 26.0–34.0)
MCHC: 35.2 g/dL (ref 30.0–36.0)
MCHC: 34.9 g/dL (ref 30.0–36.0)
MCV: 83.9 fL (ref 78.0–100.0)
MCV: 83.9 fL (ref 78.0–100.0)
Platelets: 192 10*3/uL (ref 150–400)
Platelets: 192 10*3/uL (ref 150–400)
RBC: 4.71 MIL/uL (ref 3.87–5.11)
RBC: 4.54 MIL/uL (ref 3.87–5.11)
RDW: 14.2 % (ref 11.5–15.5)
RDW: 14.1 % (ref 11.5–15.5)
WBC: 13.1 10*3/uL — AB (ref 4.0–10.5)
WBC: 16.1 10*3/uL — AB (ref 4.0–10.5)

## 2014-10-15 LAB — RPR: RPR: NONREACTIVE

## 2014-10-15 MED ORDER — DIPHENHYDRAMINE HCL 50 MG/ML IJ SOLN: 12.5000 mg | INTRAMUSCULAR | Status: DC | PRN

## 2014-10-15 MED ORDER — PHENYLEPHRINE 40 MCG/ML (10ML) SYRINGE FOR IV PUSH (FOR BLOOD PRESSURE SUPPORT)
80.0000 ug | PREFILLED_SYRINGE | INTRAVENOUS | Status: DC | PRN
  Filled 2014-10-15: qty 20

## 2014-10-15 MED ORDER — LANOLIN HYDROUS EX OINT: TOPICAL_OINTMENT | CUTANEOUS | Status: DC | PRN

## 2014-10-15 MED ORDER — ZOLPIDEM TARTRATE 5 MG PO TABS: 5.0000 mg | ORAL_TABLET | Freq: Every evening | ORAL | Status: DC | PRN

## 2014-10-15 MED ORDER — WITCH HAZEL-GLYCERIN EX PADS: 1.0000 "application " | MEDICATED_PAD | CUTANEOUS | Status: DC | PRN

## 2014-10-15 MED ORDER — FENTANYL 2.5 MCG/ML BUPIVACAINE 1/10 % EPIDURAL INFUSION (WH - ANES): 14.0000 mL/h | INTRAMUSCULAR | Status: DC | PRN

## 2014-10-15 MED ORDER — ONDANSETRON HCL 4 MG/2ML IJ SOLN: 4.0000 mg | INTRAMUSCULAR | Status: DC | PRN

## 2014-10-15 MED ORDER — DIPHENHYDRAMINE HCL 25 MG PO CAPS: 25.0000 mg | ORAL_CAPSULE | Freq: Four times a day (QID) | ORAL | Status: DC | PRN

## 2014-10-15 MED ORDER — EPHEDRINE 5 MG/ML INJ: 10.0000 mg | INTRAVENOUS | Status: DC | PRN

## 2014-10-15 MED ORDER — ACETAMINOPHEN 325 MG PO TABS: 650.0000 mg | ORAL_TABLET | ORAL | Status: DC | PRN

## 2014-10-15 MED ORDER — LIDOCAINE HCL (PF) 1 % IJ SOLN
INTRAMUSCULAR | Status: DC | PRN
  Administered 2014-10-15 (×2): 5 mL

## 2014-10-15 MED ORDER — SENNOSIDES-DOCUSATE SODIUM 8.6-50 MG PO TABS
2.0000 | ORAL_TABLET | ORAL | Status: DC
  Administered 2014-10-15 – 2014-10-16 (×2): 2 via ORAL
  Filled 2014-10-15 (×2): qty 2

## 2014-10-15 MED ORDER — FENTANYL 2.5 MCG/ML BUPIVACAINE 1/10 % EPIDURAL INFUSION (WH - ANES)
14.0000 mL/h | INTRAMUSCULAR | Status: DC | PRN
  Filled 2014-10-15: qty 125

## 2014-10-15 MED ORDER — SIMETHICONE 80 MG PO CHEW: 80.0000 mg | CHEWABLE_TABLET | ORAL | Status: DC | PRN

## 2014-10-15 MED ORDER — BENZOCAINE-MENTHOL 20-0.5 % EX AERO
1.0000 "application " | INHALATION_SPRAY | CUTANEOUS | Status: DC | PRN
  Filled 2014-10-15: qty 56

## 2014-10-15 MED ORDER — ONDANSETRON HCL 4 MG PO TABS: 4.0000 mg | ORAL_TABLET | ORAL | Status: DC | PRN

## 2014-10-15 MED ORDER — FENTANYL 2.5 MCG/ML BUPIVACAINE 1/10 % EPIDURAL INFUSION (WH - ANES)
INTRAMUSCULAR | Status: DC | PRN
  Administered 2014-10-15: 14 mL/h via EPIDURAL

## 2014-10-15 MED ORDER — TETANUS-DIPHTH-ACELL PERTUSSIS 5-2.5-18.5 LF-MCG/0.5 IM SUSP
0.5000 mL | Freq: Once | INTRAMUSCULAR | Status: DC
  Filled 2014-10-15: qty 0.5

## 2014-10-15 MED ORDER — OXYTOCIN 40 UNITS IN LACTATED RINGERS INFUSION - SIMPLE MED: 62.5000 mL/h | INTRAVENOUS | Status: DC | PRN

## 2014-10-15 MED ORDER — DIBUCAINE 1 % RE OINT
1.0000 "application " | TOPICAL_OINTMENT | RECTAL | Status: DC | PRN
  Filled 2014-10-15: qty 28

## 2014-10-15 MED ORDER — MEASLES, MUMPS & RUBELLA VAC ~~LOC~~ INJ
0.5000 mL | INJECTION | Freq: Once | SUBCUTANEOUS | Status: DC
  Filled 2014-10-15: qty 0.5

## 2014-10-15 MED ORDER — PRENATAL MULTIVITAMIN CH
1.0000 | ORAL_TABLET | Freq: Every day | ORAL | Status: DC
  Administered 2014-10-16 – 2014-10-17 (×2): 1 via ORAL
  Filled 2014-10-15 (×2): qty 1

## 2014-10-15 NOTE — Progress Notes (Signed)
Assisted RN with interpretation of patient admit to unit.  Spanish Interpreter

## 2014-10-15 NOTE — Progress Notes (Signed)
Checked on patients needs.  °Spanish Interpreter  °

## 2014-10-15 NOTE — Anesthesia Procedure Notes (Signed)
Epidural Patient location during procedure: OB Start time: 10/15/2014 6:14 AM End time: 10/15/2014 6:33 AM  Staffing Anesthesiologist: Sebastian Ache  Preanesthetic Checklist Completed: patient identified, site marked, surgical consent, pre-op evaluation, timeout performed, IV checked, risks and benefits discussed and monitors and equipment checked  Epidural Patient position: sitting Prep: site prepped and draped and DuraPrep Patient monitoring: heart rate, continuous pulse ox and blood pressure Approach: midline Location: L3-L4 Injection technique: LOR air  Needle:  Needle type: Tuohy  Needle gauge: 17 G Needle length: 9 cm and 9 Needle insertion depth: 8.5 cm Catheter type: closed end flexible Catheter size: 20 Guage Catheter at skin depth: 15 cm Test dose: negative  Assessment Events: blood not aspirated, injection not painful, no injection resistance, negative IV test and no paresthesia  Additional Notes   Patient tolerated the insertion well without complications.Reason for block:procedure for pain

## 2014-10-15 NOTE — Progress Notes (Signed)
Sabrina Riley is a 19 y.o. G1P0 at [redacted]w[redacted]d admitted for induction of labor due to [facility].  Subjective: Pt breathing with contractions, requesting IV pain medication.  Pt does not desire epidural for pain.  Family in room for support.  Objective: BP 128/68 mmHg  Pulse 64  Temp(Src) 98.6 F (37 C) (Oral)  Resp 20  Ht 5' 2.25" (1.581 m)  Wt 137.553 kg (303 lb 4 oz)  BMI 55.03 kg/m2      FHT:  FHR: 145 bpm, variability: moderate,  accelerations:  Present,  decelerations:  Absent UC:   Not tracing on toco, difficult to palpate r/t body habitus.  Pt breathing with contractions every 3-4 minutes. SVE:   Dilation: 5 Effacement (%): 90 Station: +1 Exam by:: Larose Kells RN  Labs: Lab Results  Component Value Date   WBC 9.2 10/14/2014   HGB 13.4 10/14/2014   HCT 38.1 10/14/2014   MCV 83.7 10/14/2014   PLT 192 10/14/2014    Assessment / Plan: Induction of labor due to gestational hypertension,  progressing well on pitocin  Labor: Progressing normally.  Plan to check cervix in 1 hour.  If no progress, AROM and IUPC.   Preeclampsia:  n/a Fetal Wellbeing:  Category I Pain Control:  Fentanyl I/D:  n/a Anticipated MOD:  NSVD  LEFTWICH-KIRBY, LISA 10/15/2014, 4:34 AM

## 2014-10-15 NOTE — Progress Notes (Signed)
Assited RN with interpretation of patient assessment.  Spanish Interpreter

## 2014-10-15 NOTE — Progress Notes (Signed)
Sabrina Riley is a 19 y.o. G1P0 at [redacted]w[redacted]d admitted for induction of labor due to [facility].  Subjective: Pt comfortable with epidural. Family in room for support.  Objective: BP 118/55 mmHg  Pulse 74  Temp(Src) 98.7 F (37.1 C) (Oral)  Resp 18  Ht 5' 2.25" (1.581 m)  Wt 137.553 kg (303 lb 4 oz)  BMI 55.03 kg/m2  SpO2 94%      FHT:  FHR: 135 bpm, variability: moderate,  accelerations:  Present,  decelerations:  Absent UC:   None on toco  SVE:   Dilation: 6 Effacement (%): 100 Station: 0, +1 Exam by:: L. Leftwich,CNM  AROM with clear fluid, IUPC and FSE placed.  Pt tolerated well.   Labs: Lab Results  Component Value Date   WBC 13.1* 10/15/2014   HGB 13.9 10/15/2014   HCT 39.5 10/15/2014   MCV 83.9 10/15/2014   PLT 192 10/15/2014    Assessment / Plan: Induction of labor due to gestational hypertension,  progressing well on pitocin  Labor: Progressing normally Preeclampsia:  n/a Fetal Wellbeing:  Category I Pain Control:  Epidural I/D:  n/a Anticipated MOD:  NSVD  LEFTWICH-KIRBY, Sabrina Riley 10/15/2014, 8:14 AM

## 2014-10-15 NOTE — Anesthesia Preprocedure Evaluation (Addendum)
Anesthesia Evaluation  Patient identified by MRN, date of birth, ID band Patient awake and Patient confused    Reviewed: Allergy & Precautions, H&P , NPO status , Patient's Chart, lab work & pertinent test results  Airway Mallampati: II       Dental   Pulmonary  breath sounds clear to auscultation  Pulmonary exam normal       Cardiovascular Exercise Tolerance: Good hypertension, On Medications Normal cardiovascular examRhythm:regular Rate:Normal     Neuro/Psych    GI/Hepatic   Endo/Other  Morbid obesityBMI 55  Renal/GU      Musculoskeletal   Abdominal (+) + obese,   Peds  Hematology   Anesthesia Other Findings   Reproductive/Obstetrics (+) Pregnancy                            Anesthesia Physical Anesthesia Plan  ASA: III  Anesthesia Plan: Epidural   Post-op Pain Management:    Induction:   Airway Management Planned:   Additional Equipment:   Intra-op Plan:   Post-operative Plan:   Informed Consent: I have reviewed the patients History and Physical, chart, labs and discussed the procedure including the risks, benefits and alternatives for the proposed anesthesia with the patient or authorized representative who has indicated his/her understanding and acceptance.     Plan Discussed with:   Anesthesia Plan Comments:         Anesthesia Quick Evaluation

## 2014-10-15 NOTE — Lactation Note (Signed)
This note was copied from the chart of Sabrina Tommie Lozano-Olvera. Lactation Consultation Note  Patient Name: Sabrina Riley ZOXWR'U Date: 10/15/2014 Reason for consult: Initial assessment Benita, the In-house Spanish interpreter present for visit. Mom reports she thinks baby is latching well, denies discomfort. Basic teaching reviewed with Mom. Encouraged to BF with feeding ques. Baby giving feeding ques at this visit but Mom declined to put baby to breast. Company present. Mom reports baby recently BF. Lactation brochure left for review, advised of OP services and support group. Will ask RN to review hand expression since company present at this visit. Encouraged Mom to call for assist or for questions/concerns.   Maternal Data Does the patient have breastfeeding experience prior to this delivery?: No  Feeding Feeding Type: Breast Fed  LATCH Score/Interventions Latch: Grasps breast easily, tongue down, lips flanged, rhythmical sucking.  Audible Swallowing: A few with stimulation  Type of Nipple: Everted at rest and after stimulation  Comfort (Breast/Nipple): Soft / non-tender     Hold (Positioning): No assistance needed to correctly position infant at breast.  LATCH Score: 9  Lactation Tools Discussed/Used     Consult Status Consult Status: Follow-up Date: 10/15/14 Follow-up type: In-patient    Alfred Levins 10/15/2014, 10:04 PM

## 2014-10-15 NOTE — Progress Notes (Signed)
Assisted RN from lactation with interpretation of breastfeeding instructions.  Spanish Interpreter

## 2014-10-16 MED ORDER — DOCUSATE SODIUM 100 MG PO CAPS: 100.0000 mg | ORAL_CAPSULE | Freq: Two times a day (BID) | ORAL | Status: DC | PRN

## 2014-10-16 NOTE — Anesthesia Postprocedure Evaluation (Signed)
Anesthesia Post Note  Patient: Sabrina Riley  Procedure(s) Performed: * No procedures listed *  Anesthesia type: Epidural  Patient location: Mother/Baby  Post pain: Pain level controlled  Post assessment: Post-op Vital signs reviewed  Last Vitals:  Filed Vitals:   10/16/14 0629  BP: 131/66  Pulse: 91  Temp: 36.4 C  Resp: 17    Post vital signs: Reviewed  Level of consciousness:alert  Complications: No apparent anesthesia complications

## 2014-10-16 NOTE — Progress Notes (Signed)
Checked on patients needs.  °Spanish Interpreter  °

## 2014-10-16 NOTE — Discharge Instructions (Signed)

## 2014-10-16 NOTE — Progress Notes (Signed)
Checked on patients needs.  Also assisted RN with interpretation of patient assessment.  °Spanish Interpreter  °

## 2014-10-16 NOTE — Discharge Summary (Signed)
Obstetric Discharge Summary Reason for Admission: induction of labor Prenatal Procedures: NST and ultrasound Intrapartum Procedures: spontaneous vaginal delivery Postpartum Procedures: none Complications-Operative and Postpartum: none  Delivery Note At 11:42 AM a viable female was delivered via Vaginal, Spontaneous Delivery (Presentation: ; Occiput Anterior). APGAR: 9, 9; weight 7 lb 15.7 oz (3620 g).  Placenta status: Intact, Spontaneous. Cord: 3 vessels with the following complications: Short. Cord pH: not obtained  Anesthesia: Epidural  Episiotomy: None Lacerations: 1st degree Suture Repair: 3.0 vicryl Est. Blood Loss (mL): 100  Hospital Course:  Active Problems:   Obesity affecting pregnancy in second trimester, antepartum   Term pregnancy   Gestational HTN   Sabrina Riley is a 19 y.o. G1P1001 s/p SVD.  Patient was admitted for IOL due to gHTN.  She has postpartum course that was uncomplicated including no problems with ambulating, PO intake, urination, pain, or bleeding. The pt feels ready to go home and  will be discharged with outpatient follow-up.   Today: No acute events overnight.  Pt denies problems with ambulating, voiding or po intake.  She denies nausea or vomiting.  Pain is well controlled.  She has had flatus. She has had bowel movement.  Lochia Small.  Plan for birth control is IUD.  Method of Feeding: Breast.  Physical Exam:  General: alert, cooperative and no distress Lochia: appropriate Uterine Fundus: firm DVT Evaluation: No evidence of DVT seen on physical exam.  H/H: Lab Results  Component Value Date/Time   HGB 13.3 10/15/2014 12:53 PM   HCT 38.1 10/15/2014 12:53 PM    Discharge Diagnoses: Term Pregnancy-delivered  Discharge Information: Date: 10/16/2014 Activity: pelvic rest Diet: routine  Medications: PNV and Colace Breast feeding:  Yes Condition: stable Instructions: refer to handout Discharge to: home     Medication List     TAKE these medications        docusate sodium 100 MG capsule  Commonly known as:  COLACE  Take 1 capsule (100 mg total) by mouth 2 (two) times daily as needed for mild constipation.     prenatal multivitamin Tabs tablet  Take 1 tablet by mouth daily at 12 noon.       Follow-up Information    Schedule an appointment as soon as possible for a visit with [facility] [place].   Why:  for postpartum follow-up   Contact information:   [address] [facility] [address] [phone]      Caryl Ada, DO 10/16/2014, 8:46 AM PGY-2, [facility]

## 2014-10-17 NOTE — Lactation Note (Signed)
This note was copied from the chart of Sabrina Shineka Lozano-Olvera. Lactation Consultation Note  Patient Name: Sabrina Riley ZOXWR'U Date: 10/17/2014 Reason for consult: Follow-up assessment  With this mom of a term baby, now 77 hours old. The baby has not passed a stool in 24 hours, so is not going home at this time. On exam of mom, she has extremely wide sset breasts, by normal appearing symetrical breasts, with easily expressed colostrum. Parents described the baby as 'gassy", but I explained she was hungry and cluster feeding, and tht mom's milk will come in in the next 24 hours hopefully, and then the baby will still feed with cues, but be more satisfied. i assisted mom with latching in cross cradle. The baby has an upper ip frenulum that extends o the gum line, and a tongue that may have some posterior frenulum restrictions. The baby did latch after a couple of attempts, and mom reports no discomfort, and baby is causing good breast movement. I gave mom comofrt gels and instructed her in their use, and a ahnd pump and also instructed her in it's use. Mom knows to call for questions/concerns.    Maternal Data    Feeding Feeding Type: Breast Fed Length of feed: 15 min  LATCH Score/Interventions Latch: Repeated attempts needed to sustain latch, nipple held in mouth throughout feeding, stimulation needed to elicit sucking reflex. Intervention(s): Adjust position;Assist with latch;Breast compression  Audible Swallowing: A few with stimulation  Type of Nipple: Everted at rest and after stimulation (extremely wide set breasts, equal in size with normal appearance and good nipples, eailsy expressed colostrum. )  Comfort (Breast/Nipple): Filling, red/small blisters or bruises, mild/mod discomfort  Problem noted: Mild/Moderate discomfort (nipple stripes on both breasts) Interventions (Mild/moderate discomfort): Comfort gels  Hold (Positioning): Assistance needed to correctly  position infant at breast and maintain latch. Intervention(s): Breastfeeding basics reviewed;Support Pillows;Position options;Skin to skin  LATCH Score: 6  Lactation Tools Discussed/Used     Consult Status Consult Status: Follow-up Date: 10/18/14 Follow-up type: In-patient    Alfred Levins 10/17/2014, 9:02 AM

## 2014-10-17 NOTE — Discharge Summary (Signed)
Obstetric Discharge Summary Reason for Admission: onset of labor Prenatal Procedures: ultrasound Intrapartum Procedures: spontaneous vaginal delivery Postpartum Procedures: none Complications-Operative and Postpartum: none HEMOGLOBIN  Date Value Ref Range Status  10/15/2014 13.3 12.0 - 15.0 g/dL Final   HCT  Date Value Ref Range Status  10/15/2014 38.1 36.0 - 46.0 % Final    Physical Exam:  General: alert, cooperative, appears stated age and no distress Lochia: appropriate Uterine Fundus: firm Incision: n/a DVT Evaluation: No evidence of DVT seen on physical exam. Negative Homan's sign. No cords or calf tenderness.  Discharge Diagnoses: Term Pregnancy-delivered  Discharge Information: Date: 10/17/2014 Activity: pelvic rest Diet: routine Medications: PNV and Ibuprofen Condition: stable and improved Instructions: refer to practice specific booklet Discharge to: home Follow-up Information    Schedule an appointment as soon as possible for a visit with [facility] [place].   Why:  for postpartum follow-up   Contact information:   [address] [place] [address] [phone]      Newborn Data: Live born female  Birth Weight: 7 lb 15.7 oz (3620 g) APGAR: 9, 9  Home with mother.  Sabrina Riley 10/17/2014, 5:43 AM

## 2014-10-20 ENCOUNTER — Inpatient Hospital Stay (HOSPITAL_COMMUNITY): Admission: RE | Admit: 2014-10-20 | Payer: Self-pay | Source: Ambulatory Visit

## 2016-08-11 IMAGING — US US OB DETAIL+14 WK
1 series · 12 of 28 positions shown · non-contrast
Comparison: none

[Series 1: us ob +14 all · 12 of 54 slices shown]
[im 2/54]
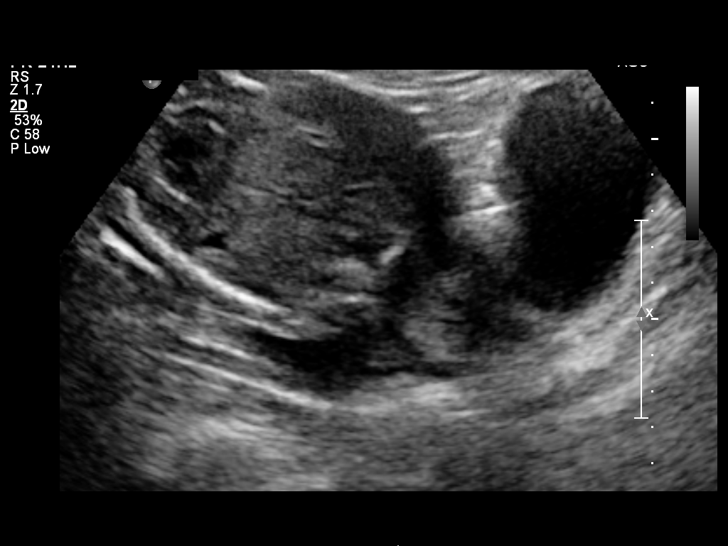
[im 6/54]
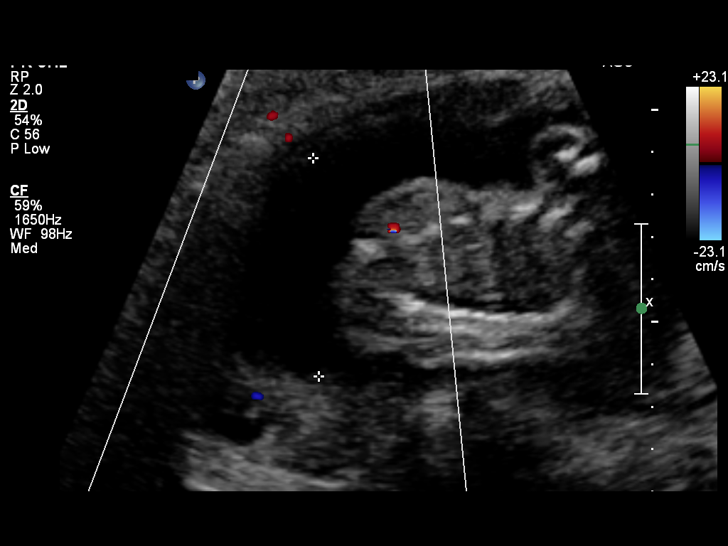
[im 10/54]
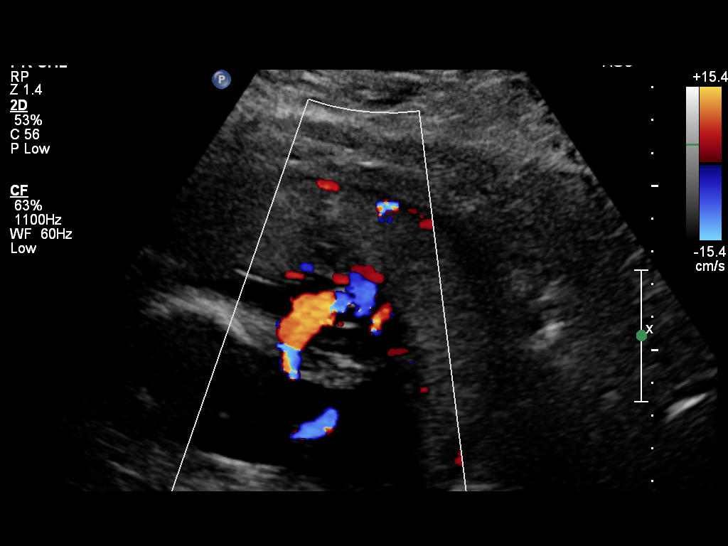
[im 16/54]
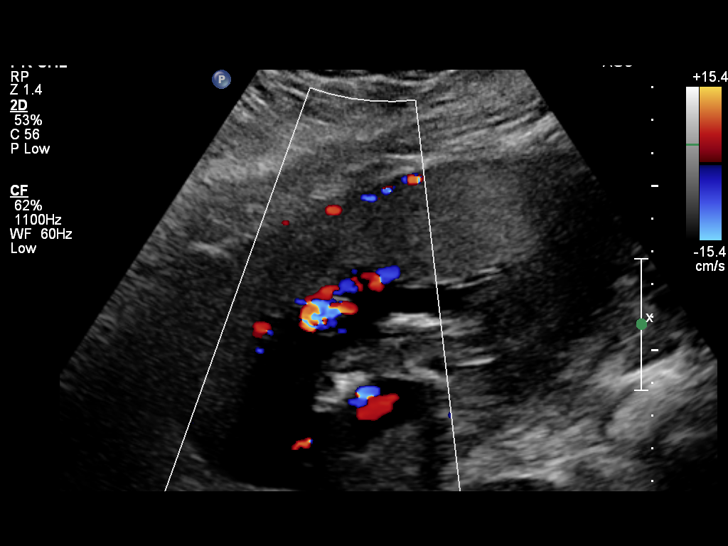
[im 20/54]
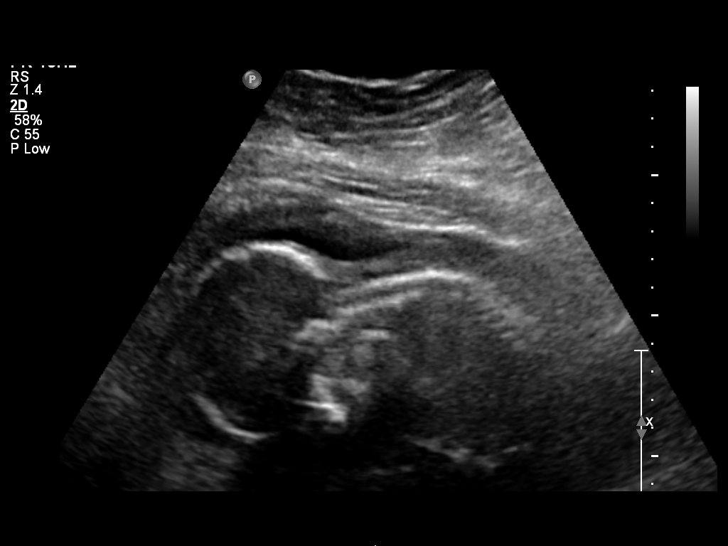
[im 24/54]
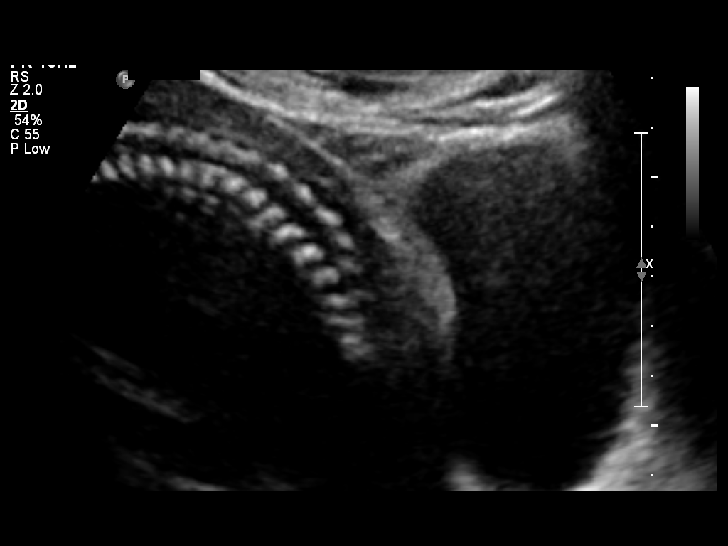
[im 30/54]
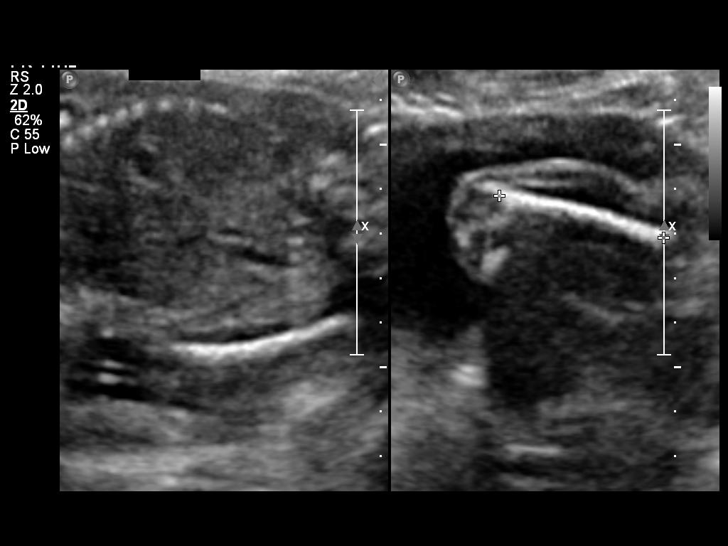
[im 34/54]
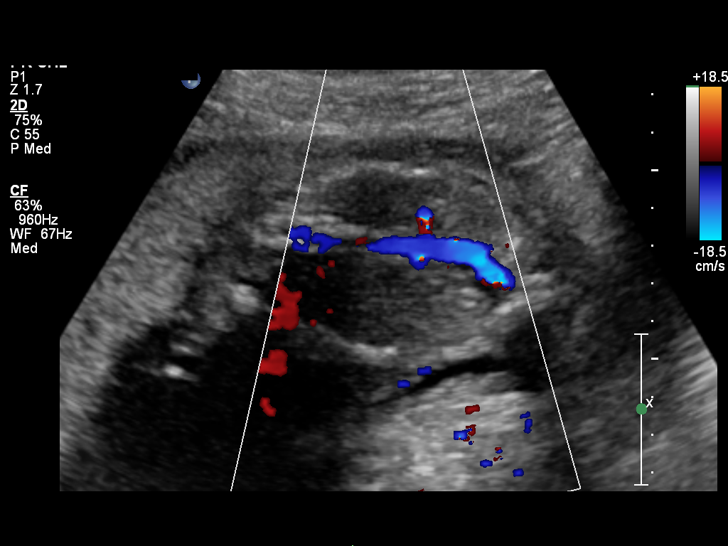
[im 38/54]
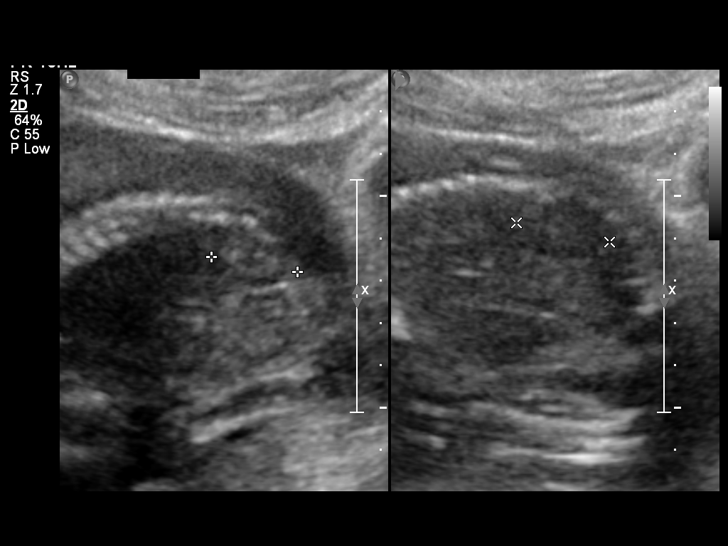
[im 44/54]
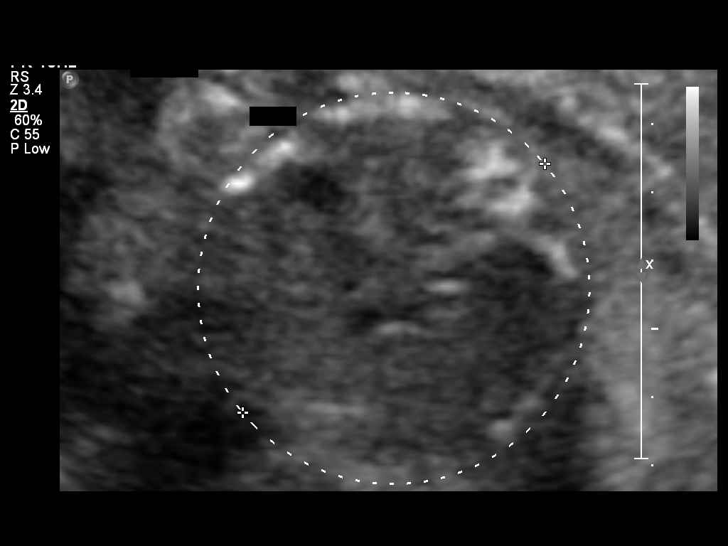
[im 48/54]
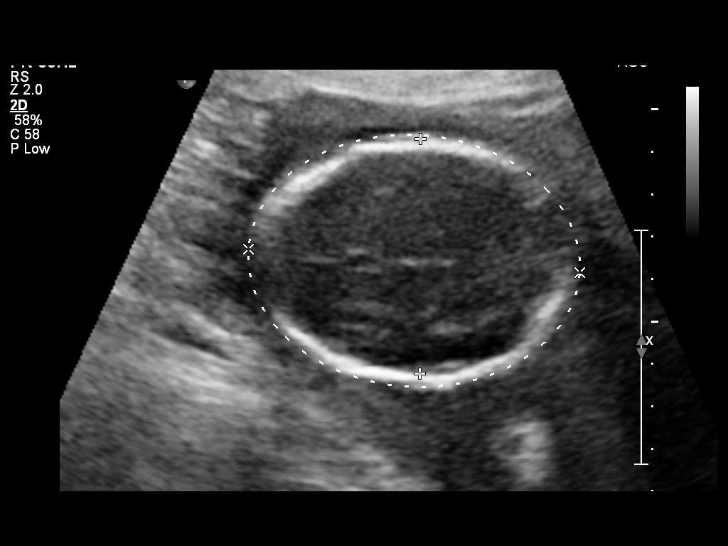
[im 52/54]
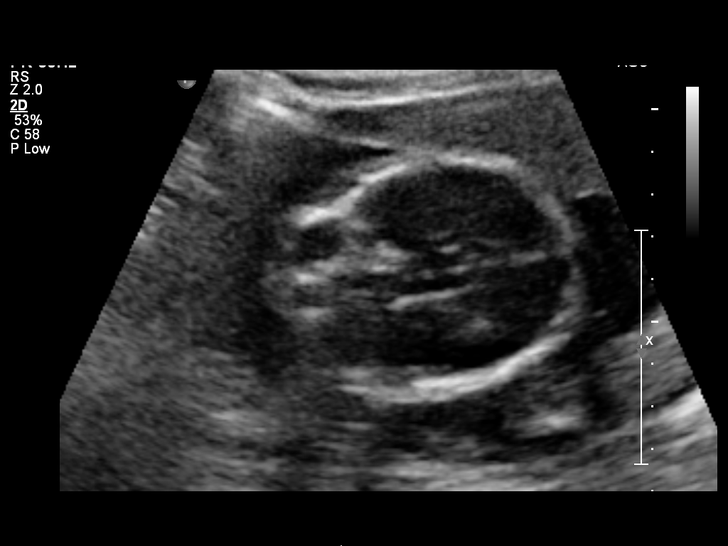

[12 of 28 positions shown; findings below may reference images not displayed]

OBSTETRICS REPORT
(Signed Final 06/13/2014 [DATE])

NAVI

Service(s) Provided

US OB DETAIL + 14 WK                                  76811.0
Indications

22 weeks gestation of pregnancy
Detailed fetal anatomic survey                        Z36
Obesity complicating pregnancy, second trimester
Fetal Evaluation

Num Of Fetuses:    1
Fetal Heart Rate:  160                          bpm
Cardiac Activity:  Observed
Presentation:      Breech
Placenta:          Anterior, above cervical os
P. Cord            Visualized, central
Insertion:

Amniotic Fluid
AFI FV:      Subjectively within normal limits
Larg Pckt:    5.12  cm
Biometry

BPD:     54.7  mm     G. Age:  22w 5d                CI:        66.67   70 - 86
FL/HC:      18.3   19.2 -
20.8
HC:     214.7  mm     G. Age:  23w 4d       69  %    HC/AC:      1.20   1.05 -
1.21
AC:       179  mm     G. Age:  22w 6d       44  %    FL/BPD:     71.8   71 - 87
FL:      39.3  mm     G. Age:  22w 4d       37  %    FL/AC:      22.0   20 - 24
HUM:     38.5  mm     G. Age:  23w 5d       65  %

Est. FW:     535  gm      1 lb 3 oz     53  %
Gestational Age

U/S Today:     22w 6d                                        EDD:   10/11/14
Best:          22w 5d     Det. By:  Early Ultrasound         EDD:   10/12/14
(03/16/14)
Anatomy
Cranium:          Appears normal         Aortic Arch:      Not well visualized
Fetal Cavum:      Not well visualized    Ductal Arch:      Not well visualized
Ventricles:       Appears normal         Diaphragm:        Not well visualized
Choroid Plexus:   Not well visualized    Stomach:          Appears normal, left
sided
Cerebellum:       Not well visualized    Abdomen:          Not well visualized
Posterior Fossa:  Not well visualized    Abdominal Wall:   Not well visualized
Nuchal Fold:      Not applicable (>20    Cord Vessels:     Appears normal (3
wks GA)                                  vessel cord)
Face:             Not well visualized    Kidneys:          Appear normal
Lips:             Not well visualized    Bladder:          Appears normal
Heart:            Not well visualized    Spine:            Limited views
appear normal
RVOT:             Not well visualized    Lower             Appears normal
Extremities:
LVOT:             Not well visualized    Upper             Appears normal
Extremities:

Other:  Technically difficult due to maternal habitus and fetal position.
Cervix Uterus Adnexa

Cervical Length:    3.3      cm

Cervix:       Normal appearance by transabdominal scan.
Uterus:       No abnormality visualized.
Cul De Sac:   No free fluid seen.
Left Ovary:    Not visualized.
Right Ovary:   Not visualized.
Adnexa:     No abnormality visualized.
Impression

Single IUP at 22w 5d
Limited views of the fetal anatomy obtained due to maternal
habitus and fetal position
No gross anomalies noted
Anterior placenta without previa
Normal amniotic fluid volume
Recommendations

Recommend follow-up ultrasound examination in 4 weeks to
reevaluate the fetal anatomy

## 2017-10-16 ENCOUNTER — Other Ambulatory Visit (HOSPITAL_COMMUNITY): Payer: Self-pay | Admitting: Nurse Practitioner

## 2017-10-16 DIAGNOSIS — Z3682 Encounter for antenatal screening for nuchal translucency: Secondary | ICD-10-CM

## 2017-10-16 DIAGNOSIS — Z3A13 13 weeks gestation of pregnancy: Secondary | ICD-10-CM

## 2017-10-16 LAB — OB RESULTS CONSOLE RUBELLA ANTIBODY, IGM: Rubella: IMMUNE

## 2017-10-16 LAB — OB RESULTS CONSOLE GC/CHLAMYDIA
Chlamydia: NEGATIVE
Gonorrhea: NEGATIVE

## 2017-10-16 LAB — OB RESULTS CONSOLE HEPATITIS B SURFACE ANTIGEN: Hepatitis B Surface Ag: NEGATIVE

## 2017-10-16 LAB — OB RESULTS CONSOLE RPR: RPR: NONREACTIVE

## 2017-10-29 ENCOUNTER — Encounter (HOSPITAL_COMMUNITY): Payer: Self-pay

## 2017-11-05 ENCOUNTER — Other Ambulatory Visit (HOSPITAL_COMMUNITY): Payer: Self-pay

## 2017-11-05 ENCOUNTER — Ambulatory Visit (HOSPITAL_COMMUNITY): Payer: Self-pay

## 2017-11-05 ENCOUNTER — Encounter (HOSPITAL_COMMUNITY): Payer: Self-pay

## 2018-02-25 NOTE — L&D Delivery Note (Addendum)
Patient: Meridyth Schade MRN: [id number]  GBS status: negative, IAP given None  Patient is a 23 y.o. now G2P2 s/p NSVD at [redacted]w[redacted]d, who was admitted for SOL and NRNST in MAU. AROM ~5-10 minutes prior to delivery with white mucousy fluid.   Delivery Note At 3:43 PM a viable and healthy female was delivered via  (Presentation: vertex; OA).  APGAR: pending; weight pending.   Placenta status: spontaneous, intact.  3 vessel cord. With no complications.   Anesthesia:  Epidural Episiotomy:  None Lacerations:  None Suture Repair: None Est. Blood Loss (mL):  100  Head delivered OA. One nuchal cord present. Shoulder and body delivered in summersault fashion to relieve nuchal cord. Infant with spontaneous cry, placed on mother's abdomen, dried and bulb suctioned. Cord clamped x 2 after 1-minute delay, and cut by family member. Cord blood drawn. Placenta delivered spontaneously with gentle cord traction. Fundus firm with massage and Pitocin. Perineum inspected and found to have a superficial right superior labial laceration, which was found to be hemostatic.  Mom to postpartum.  Baby to Couplet care / Skin to Skin.  Kiersten P Mullis, DO 05/18/2018, 3:59 PM  Attestation: I have seen this patient and agree with the resident's documentation. I was present for the entire delivery, no repair indicated.   Cristal Deer. Earlene Plater, DO OB/GYN Fellow

## 2018-04-20 LAB — OB RESULTS CONSOLE GC/CHLAMYDIA
CHLAMYDIA, DNA PROBE: NEGATIVE
Gonorrhea: NEGATIVE

## 2018-04-20 LAB — OB RESULTS CONSOLE GBS: GBS: NEGATIVE

## 2018-05-18 ENCOUNTER — Other Ambulatory Visit: Payer: Self-pay

## 2018-05-18 ENCOUNTER — Inpatient Hospital Stay (HOSPITAL_COMMUNITY): Payer: Medicaid Other | Admitting: Anesthesiology

## 2018-05-18 ENCOUNTER — Inpatient Hospital Stay (HOSPITAL_COMMUNITY)
Admission: AD | Admit: 2018-05-18 | Discharge: 2018-05-19 | DRG: 807 | Disposition: A | Discharge status: Home / Self Care | Payer: Medicaid Other | Attending: Obstetrics and Gynecology | Admitting: Obstetrics and Gynecology

## 2018-05-18 ENCOUNTER — Encounter (HOSPITAL_COMMUNITY): Payer: Self-pay

## 2018-05-18 DIAGNOSIS — Z3A40 40 weeks gestation of pregnancy: Secondary | ICD-10-CM | POA: Diagnosis not present

## 2018-05-18 DIAGNOSIS — O480 Post-term pregnancy: Secondary | ICD-10-CM

## 2018-05-18 DIAGNOSIS — Z8759 Personal history of other complications of pregnancy, childbirth and the puerperium: Secondary | ICD-10-CM

## 2018-05-18 DIAGNOSIS — O99214 Obesity complicating childbirth: Secondary | ICD-10-CM | POA: Diagnosis present

## 2018-05-18 DIAGNOSIS — O288 Other abnormal findings on antenatal screening of mother: Secondary | ICD-10-CM | POA: Diagnosis present

## 2018-05-18 HISTORY — DX: Morbid (severe) obesity due to excess calories: E66.01

## 2018-05-18 LAB — CBC
HCT: 42.2 % (ref 36.0–46.0)
Hemoglobin: 14.2 g/dL (ref 12.0–15.0)
MCH: 28.7 pg (ref 26.0–34.0)
MCHC: 33.6 g/dL (ref 30.0–36.0)
MCV: 85.3 fL (ref 80.0–100.0)
NRBC: 0 % (ref 0.0–0.2)
PLATELETS: 190 10*3/uL (ref 150–400)
RBC: 4.95 MIL/uL (ref 3.87–5.11)
RDW: 13.5 % (ref 11.5–15.5)
WBC: 9.3 10*3/uL (ref 4.0–10.5)

## 2018-05-18 LAB — TYPE AND SCREEN
ABO/RH(D): O POS
Antibody Screen: NEGATIVE

## 2018-05-18 LAB — RPR: RPR: NONREACTIVE

## 2018-05-18 LAB — ABO/RH: ABO/RH(D): O POS

## 2018-05-18 MED ORDER — IBUPROFEN 600 MG PO TABS
600.0000 mg | ORAL_TABLET | Freq: Four times a day (QID) | ORAL | Status: DC
  Administered 2018-05-18 – 2018-05-19 (×4): 600 mg via ORAL
  Filled 2018-05-18 (×4): qty 1

## 2018-05-18 MED ORDER — LIDOCAINE-EPINEPHRINE (PF) 2 %-1:200000 IJ SOLN
INTRAMUSCULAR | Status: DC | PRN
  Administered 2018-05-18 (×2): 3 mL via EPIDURAL

## 2018-05-18 MED ORDER — SODIUM CHLORIDE (PF) 0.9 % IJ SOLN
INTRAMUSCULAR | Status: DC | PRN
  Administered 2018-05-18: 12 mL/h via EPIDURAL

## 2018-05-18 MED ORDER — LACTATED RINGERS IV SOLN
500.0000 mL | Freq: Once | INTRAVENOUS | Status: AC
  Administered 2018-05-18: 500 mL via INTRAVENOUS

## 2018-05-18 MED ORDER — LIDOCAINE HCL (PF) 1 % IJ SOLN: 30.0000 mL | INTRAMUSCULAR | Status: DC | PRN

## 2018-05-18 MED ORDER — LACTATED RINGERS IV SOLN: 500.0000 mL | INTRAVENOUS | Status: DC | PRN

## 2018-05-18 MED ORDER — EPHEDRINE 5 MG/ML INJ: 10.0000 mg | INTRAVENOUS | Status: DC | PRN

## 2018-05-18 MED ORDER — SIMETHICONE 80 MG PO CHEW: 80.0000 mg | CHEWABLE_TABLET | ORAL | Status: DC | PRN

## 2018-05-18 MED ORDER — PRENATAL MULTIVITAMIN CH
1.0000 | ORAL_TABLET | Freq: Every day | ORAL | Status: DC
  Administered 2018-05-19: 1 via ORAL
  Filled 2018-05-18: qty 1

## 2018-05-18 MED ORDER — OXYCODONE-ACETAMINOPHEN 5-325 MG PO TABS: 1.0000 | ORAL_TABLET | ORAL | Status: DC | PRN

## 2018-05-18 MED ORDER — PHENYLEPHRINE 40 MCG/ML (10ML) SYRINGE FOR IV PUSH (FOR BLOOD PRESSURE SUPPORT): 80.0000 ug | PREFILLED_SYRINGE | INTRAVENOUS | Status: DC | PRN

## 2018-05-18 MED ORDER — OXYCODONE-ACETAMINOPHEN 5-325 MG PO TABS: 2.0000 | ORAL_TABLET | ORAL | Status: DC | PRN

## 2018-05-18 MED ORDER — ONDANSETRON HCL 4 MG PO TABS: 4.0000 mg | ORAL_TABLET | ORAL | Status: DC | PRN

## 2018-05-18 MED ORDER — SENNOSIDES-DOCUSATE SODIUM 8.6-50 MG PO TABS
2.0000 | ORAL_TABLET | ORAL | Status: DC
  Administered 2018-05-18: 2 via ORAL
  Filled 2018-05-18: qty 2

## 2018-05-18 MED ORDER — OXYTOCIN 40 UNITS IN NORMAL SALINE INFUSION - SIMPLE MED
2.5000 [IU]/h | INTRAVENOUS | Status: DC
  Filled 2018-05-18: qty 1000

## 2018-05-18 MED ORDER — ACETAMINOPHEN 325 MG PO TABS: 650.0000 mg | ORAL_TABLET | ORAL | Status: DC | PRN

## 2018-05-18 MED ORDER — FENTANYL CITRATE (PF) 100 MCG/2ML IJ SOLN
100.0000 ug | INTRAMUSCULAR | Status: DC | PRN
  Administered 2018-05-18: 100 ug via INTRAVENOUS
  Filled 2018-05-18: qty 2

## 2018-05-18 MED ORDER — FENTANYL-BUPIVACAINE-NACL 0.5-0.125-0.9 MG/250ML-% EP SOLN
12.0000 mL/h | EPIDURAL | Status: DC | PRN
  Filled 2018-05-18: qty 250

## 2018-05-18 MED ORDER — DIPHENHYDRAMINE HCL 25 MG PO CAPS: 25.0000 mg | ORAL_CAPSULE | Freq: Four times a day (QID) | ORAL | Status: DC | PRN

## 2018-05-18 MED ORDER — COCONUT OIL OIL: 1.0000 "application " | TOPICAL_OIL | Status: DC | PRN

## 2018-05-18 MED ORDER — OXYTOCIN BOLUS FROM INFUSION
500.0000 mL | Freq: Once | INTRAVENOUS | Status: AC
Start: 2018-05-18 — End: 2018-05-18
  Administered 2018-05-18: 500 mL via INTRAVENOUS

## 2018-05-18 MED ORDER — BENZOCAINE-MENTHOL 20-0.5 % EX AERO: 1.0000 "application " | INHALATION_SPRAY | CUTANEOUS | Status: DC | PRN

## 2018-05-18 MED ORDER — SOD CITRATE-CITRIC ACID 500-334 MG/5ML PO SOLN: 30.0000 mL | ORAL | Status: DC | PRN

## 2018-05-18 MED ORDER — WITCH HAZEL-GLYCERIN EX PADS: 1.0000 "application " | MEDICATED_PAD | CUTANEOUS | Status: DC | PRN

## 2018-05-18 MED ORDER — ONDANSETRON HCL 4 MG/2ML IJ SOLN: 4.0000 mg | Freq: Four times a day (QID) | INTRAMUSCULAR | Status: DC | PRN

## 2018-05-18 MED ORDER — ONDANSETRON HCL 4 MG/2ML IJ SOLN: 4.0000 mg | INTRAMUSCULAR | Status: DC | PRN

## 2018-05-18 MED ORDER — LACTATED RINGERS IV SOLN
INTRAVENOUS | Status: DC
  Administered 2018-05-18 (×2): via INTRAVENOUS

## 2018-05-18 MED ORDER — DIPHENHYDRAMINE HCL 50 MG/ML IJ SOLN: 12.5000 mg | INTRAMUSCULAR | Status: DC | PRN

## 2018-05-18 MED ORDER — DIBUCAINE 1 % RE OINT: 1.0000 "application " | TOPICAL_OINTMENT | RECTAL | Status: DC | PRN

## 2018-05-18 NOTE — Discharge Summary (Addendum)
Obstetrics Discharge Summary OB/GYN Faculty Practice   Patient Name: Sabrina Riley DOB: 05-10-1995 MRN: [id number]  Date of admission: 05/18/2018 Delivering MD: Joana Reamer   Date of discharge: 05/18/2018  Admitting diagnosis: 40.4WKS CTX Intrauterine pregnancy: [redacted]w[redacted]d     Secondary diagnosis:   Principal Problem:   Non-reactive NST (non-stress test) Active Problems:   History of gestational hypertension   Labor and delivery indication for care or intervention   Morbid obesity [facility])      Discharge diagnosis: Term Pregnancy Delivered                                            Postpartum procedures: None  Complications: None  Outpatient Follow-Up: 6 weeks   Hospital course: Sabrina Riley is a 23 y.o. [redacted]w[redacted]d who was admitted for SOL and NRNST in [facility]U. Her pregnancy was uncomplicated. Her labor course was notable for AROM. Delivery was complicated by loose nuchal cord. Please see delivery/op note for additional details. Her postpartum course was uncomplicated. She was breastfeeding without difficulty. By day of discharge, she was passing flatus, urinating, eating and drinking without difficulty. Her pain was well-controlled, and she was discharged home with tylenol and ibuprofen. She will follow-up in clinic in 4-6 weeks for postpartum visit.   Physical exam  Vitals:   05/18/18 1531 05/18/18 1551 05/18/18 1601 05/18/18 1602  BP: (!) 168/96 (!) 141/77 (!) 128/101 136/68  Pulse: 96 83 76 83  Resp:      Temp:      TempSrc:      SpO2:      Weight:      Height:       General: well appearing, no distress Lochia: appropriate Uterine Fundus: firm Incision: N/A DVT Evaluation: No cords or calf tenderness. No significant calf/ankle edema. Labs: Lab Results  Component Value Date   WBC 9.3 05/18/2018   HGB 14.2 05/18/2018   HCT 42.2 05/18/2018   MCV 85.3 05/18/2018   PLT 190 05/18/2018   CMP Latest Ref Rng & Units 10/14/2014  Glucose 65 - 99 mg/dL 77  BUN 6  - 20 mg/dL 7  Creatinine 4.10 - 3.01 mg/dL 3.14  Sodium 388 - 875 mmol/L 137  Potassium 3.5 - 5.1 mmol/L 4.0  Chloride 101 - 111 mmol/L 106  CO2 22 - 32 mmol/L 20(L)  Calcium 8.9 - 10.3 mg/dL 9.3  Total Protein 6.5 - 8.1 g/dL 6.2(L)  Total Bilirubin 0.3 - 1.2 mg/dL 0.5  Alkaline Phos 38 - 126 U/L 138(H)  AST 15 - 41 U/L 24  ALT 14 - 54 U/L 21    Discharge instructions: Per After Visit Summary and "Baby and Me Booklet"  After visit meds:  Allergies as of 05/19/2018   No Known Allergies     Medication List    STOP taking these medications   docusate sodium 100 MG capsule Commonly known as:  COLACE     TAKE these medications   acetaminophen 325 MG tablet Commonly known as:  Tylenol Take 2 tablets (650 mg total) by mouth every 4 (four) hours as needed (for pain scale < 4).   ibuprofen 600 MG tablet Commonly known as:  ADVIL,MOTRIN Take 1 tablet (600 mg total) by mouth every 6 (six) hours.   prenatal multivitamin Tabs tablet Take 1 tablet by mouth daily at 12 noon.       Postpartum contraception:  Progesterone only pills and Combination OCPs Diet: Routine Diet Activity: Advance as tolerated. Pelvic rest for 6 weeks.   Follow-up Appt:No future appointments. Follow-up Visit:No follow-ups on file.  Newborn Data: Live born female  Birth Weight:   APGAR: ,   Newborn Delivery   Birth date/time:  05/18/2018 15:43:00 Delivery type:      Baby Feeding: Bottle and Breast Disposition:home with mother  Burman Nieves, MD Faculty Service, Resident   I confirm that I have verified the information documented in the Resident's note and that I have also personally reperformed the history, physical exam and all medical decision making activities of this service and have verified that all service and findings are accurately documented in this student's note.   Health department patient. Denies complaints or concerns prior to discharge.   Calvert Cantor, [place] 05/19/2018  12:08 PM

## 2018-05-18 NOTE — Anesthesia Preprocedure Evaluation (Signed)
Anesthesia Evaluation  Patient identified by MRN, date of birth, ID band Patient awake    Reviewed: Allergy & Precautions, NPO status , Patient's Chart, lab work & pertinent test results  Airway Mallampati: III  TM Distance: >3 FB Neck ROM: Full    Dental no notable dental hx.    Pulmonary asthma ,    Pulmonary exam normal breath sounds clear to auscultation       Cardiovascular hypertension, negative cardio ROS Normal cardiovascular exam Rhythm:Regular Rate:Normal     Neuro/Psych negative neurological ROS  negative psych ROS   GI/Hepatic negative GI ROS, Neg liver ROS,   Endo/Other  Morbid obesity  Renal/GU negative Renal ROS  negative genitourinary   Musculoskeletal negative musculoskeletal ROS (+)   Abdominal   Peds  Hematology negative hematology ROS (+)   Anesthesia Other Findings   Reproductive/Obstetrics (+) Pregnancy                             Anesthesia Physical Anesthesia Plan  ASA: III  Anesthesia Plan: Epidural   Post-op Pain Management:    Induction:   PONV Risk Score and Plan: Treatment may vary due to age or medical condition  Airway Management Planned: Natural Airway  Additional Equipment:   Intra-op Plan:   Post-operative Plan:   Informed Consent: I have reviewed the patients History and Physical, chart, labs and discussed the procedure including the risks, benefits and alternatives for the proposed anesthesia with the patient or authorized representative who has indicated his/her understanding and acceptance.       Plan Discussed with: Anesthesiologist  Anesthesia Plan Comments: (Patient identified. Risks, benefits, options discussed with patient including but not limited to bleeding, infection, nerve damage, paralysis, failed block, incomplete pain control, headache, blood pressure changes, nausea, vomiting, reactions to medication, itching, and post  partum back pain. Confirmed with bedside nurse the patient's most recent platelet count. Confirmed with the patient that they are not taking any anticoagulation, have any bleeding history or any family history of bleeding disorders. Patient expressed understanding and wishes to proceed. All questions were answered. )        Anesthesia Quick Evaluation

## 2018-05-18 NOTE — H&P (Addendum)
LABOR AND DELIVERY ADMISSION HISTORY AND PHYSICAL NOTE  Sabrina Riley is a 23 y.o. female G2P1001 with IUP at [redacted]w[redacted]d by LMP presenting for NRNST in MAU with early/variable decels and SOL. She reports positive fetal movement. She denies leakage of fluid or vaginal bleeding.  Prenatal History/Complications: PNC at HD Pregnancy complications:  - None  Past Medical History: Past Medical History:  Diagnosis Date  . Asthma   . Hypertension   . Morbid obesity (HCC)     Past Surgical History: Past Surgical History:  Procedure Laterality Date  . NO PAST SURGERIES      Obstetrical History: OB History    Gravida  2   Para  1   Term  1   Preterm      AB      Living  1     SAB      TAB      Ectopic      Multiple  0   Live Births  1           Social History: Social History   Socioeconomic History  . Marital status: Single    Spouse name: Not on file  . Number of children: Not on file  . Years of education: Not on file  . Highest education level: Not on file  Occupational History  . Not on file  Social Needs  . Financial resource strain: Not on file  . Food insecurity:    Worry: Not on file    Inability: Not on file  . Transportation needs:    Medical: Not on file    Non-medical: Not on file  Tobacco Use  . Smoking status: Never Smoker  . Smokeless tobacco: Never Used  Substance and Sexual Activity  . Alcohol use: No  . Drug use: No  . Sexual activity: Yes  Lifestyle  . Physical activity:    Days per week: Not on file    Minutes per session: Not on file  . Stress: Not on file  Relationships  . Social connections:    Talks on phone: Not on file    Gets together: Not on file    Attends religious service: Not on file    Active member of club or organization: Not on file    Attends meetings of clubs or organizations: Not on file    Relationship status: Not on file  Other Topics Concern  . Not on file  Social History Narrative  . Not  on file    Family History: Family History  Problem Relation Age of Onset  . Heart disease Father   . Diabetes Father   . Thyroid disease Sister     Allergies: No Known Allergies  Medications Prior to Admission  Medication Sig Dispense Refill Last Dose  . Prenatal Vit-Fe Fumarate-FA (PRENATAL MULTIVITAMIN) TABS tablet Take 1 tablet by mouth daily at 12 noon.   05/17/2018 at Unknown time  . docusate sodium (COLACE) 100 MG capsule Take 1 capsule (100 mg total) by mouth 2 (two) times daily as needed for mild constipation. 30 capsule 0      Review of Systems  All systems reviewed and negative except as stated in HPI  Physical Exam Blood pressure 124/77, pulse 78, temperature 97.6 F (36.4 C), temperature source Axillary, resp. rate 18, height 5\' 4"  (1.626 m), weight 129.2 kg, SpO2 97 %, unknown if currently breastfeeding. General appearance: alert, oriented, NAD Lungs: normal respiratory effort Heart: regular rate Abdomen: soft, non-tender; gravid,  FH appropriate for GA Extremities: No calf swelling or tenderness Presentation: cephalic Fetal monitoring: baseline 125bpm, moderate variability, + acels, no decels Uterine activity: ctx q2-5 mins Dilation: 4 Effacement (%): 70 Station: -3 Exam by:: ginger morris rn  Prenatal labs: ABO, Rh:  O positive Antibody:  Negative Rubella:  Immune RPR:   Pending HBsAg:   Negative HIV:   Pending GC/Chlamydia: Negative GBS:   Negative 1-hr GTT: 103 Genetic screening:  Quad: neg, CF: neg  Anatomy US: Normal  Prenatal Transfer Tool  Maternal Diabetes: No Genetic Screening: Normal Maternal Ultrasounds/Referrals: Normal Fetal Ultrasounds or other Referrals:  None Maternal Substance Abuse:  No Significant Maternal Medications:  None Significant Maternal Lab Results: Lab values include: Group B Strep negative  Results for orders placed or performed during the hospital encounter of 05/18/18 (from the past 24 hour(s))  CBC    Collection Time: 05/18/18  8:19 AM  Result Value Ref Range   WBC 9.3 4.0 - 10.5 K/uL   RBC 4.95 3.87 - 5.11 MIL/uL   Hemoglobin 14.2 12.0 - 15.0 g/dL   HCT 68.1 15.7 - 26.2 %   MCV 85.3 80.0 - 100.0 fL   MCH 28.7 26.0 - 34.0 pg   MCHC 33.6 30.0 - 36.0 g/dL   RDW 03.5 59.7 - 41.6 %   Platelets 190 150 - 400 K/uL   nRBC 0.0 0.0 - 0.2 %    Patient Active Problem List   Diagnosis Date Noted  . Labor and delivery indication for care or intervention 05/18/2018  . Term pregnancy 10/14/2014  . Gestational HTN 10/14/2014  . Obesity affecting pregnancy in second trimester, antepartum     Assessment: Sabrina Riley is a 23 y.o. G2P1001 at [redacted]w[redacted]d here for NRNST/SOL.  #Labor: Progressing well. Will continue expectant management at this time. Consider AROM at next cervical check if progressing well. #Pain: IV pain meds upon maternal request #FWB:  CAT I #ID:  GBS neg, GC/Cl neg  #Postpartum Planning: -- [x]  Flu / [x]  TDAP / Rubella immune -- Boy (out) / both / OCPs  Kiersten P Mullis, DO 05/18/2018, 8:58 AM  Attestation: I have seen this patient and agree with the resident's documentation. I have examined them separately, and we have discussed the plan of care.  Cristal Deer. Earlene Plater, DO OB/GYN Fellow

## 2018-05-18 NOTE — MAU Note (Signed)
Pt here with contractions every 10 mins. Pt denies LOF, but had some bloody mucous. Reports good fetal movement.

## 2018-05-18 NOTE — Anesthesia Procedure Notes (Signed)
Epidural Patient location during procedure: OB Start time: 05/18/2018 12:05 PM End time: 05/18/2018 12:20 PM  Staffing Anesthesiologist: Elmer Picker, MD Performed: anesthesiologist   Preanesthetic Checklist Completed: patient identified, pre-op evaluation, timeout performed, IV checked, risks and benefits discussed and monitors and equipment checked  Epidural Patient position: sitting Prep: site prepped and draped and DuraPrep Patient monitoring: continuous pulse ox, blood pressure, heart rate and cardiac monitor Approach: midline Location: L3-L4 Injection technique: LOR air  Needle:  Needle type: Tuohy  Needle gauge: 17 G Needle length: 9 cm Needle insertion depth: 8 cm Catheter type: closed end flexible Catheter size: 19 Gauge Catheter at skin depth: 14 cm Test dose: negative  Assessment Sensory level: T8 Events: blood not aspirated, injection not painful, no injection resistance, negative IV test and no paresthesia  Additional Notes Patient identified. Risks/Benefits/Options discussed with patient including but not limited to bleeding, infection, nerve damage, paralysis, failed block, incomplete pain control, headache, blood pressure changes, nausea, vomiting, reactions to medication both or allergic, itching and postpartum back pain. Confirmed with bedside nurse the patient's most recent platelet count. Confirmed with patient that they are not currently taking any anticoagulation, have any bleeding history or any family history of bleeding disorders. Patient expressed understanding and wished to proceed. All questions were answered. Sterile technique was used throughout the entire procedure. Please see nursing notes for vital signs. Test dose was given through epidural catheter and negative prior to continuing to dose epidural or start infusion. Warning signs of high block given to the patient including shortness of breath, tingling/numbness in hands, complete motor block,  or any concerning symptoms with instructions to call for help. Patient was given instructions on fall risk and not to get out of bed. All questions and concerns addressed with instructions to call with any issues or inadequate analgesia.  Reason for block:procedure for pain

## 2018-05-18 NOTE — Progress Notes (Signed)
LABOR PROGRESS NOTE  Sabrina Riley is a 23 y.o. G2P1001 at [redacted]w[redacted]d  admitted for SOL and NRNST in MAU.  Subjective: More comfortable with epidural. Denies any concerns or complaints.  Objective: BP (!) 149/78   Pulse 73   Temp 98.4 F (36.9 C) (Oral)   Resp 20   Ht 5\' 4"  (1.626 m)   Wt 129.2 kg   SpO2 100%   BMI 48.89 kg/m  or  Vitals:   05/18/18 1251 05/18/18 1256 05/18/18 1300 05/18/18 1301  BP: (!) 143/59 (!) 141/76  (!) 149/78  Pulse: 67 86  73  Resp:      Temp:      TempSrc:      SpO2:   100%   Weight:      Height:       Dilation: 8 Effacement (%): 100 Cervical Position: Middle Station: -2 Presentation: Vertex Exam by:: jaton burgess rnc FHT: baseline rate 125, moderate varibility, + acel, no decel Toco: ctx q2-4 mins  Labs: Lab Results  Component Value Date   WBC 9.3 05/18/2018   HGB 14.2 05/18/2018   HCT 42.2 05/18/2018   MCV 85.3 05/18/2018   PLT 190 05/18/2018    Patient Active Problem List   Diagnosis Date Noted  . Labor and delivery indication for care or intervention 05/18/2018  . Non-reactive NST (non-stress test) 05/18/2018  . Morbid obesity (HCC) 05/18/2018  . History of gestational hypertension 10/14/2014    Assessment / Plan: 23 y.o. G2P1001 at [redacted]w[redacted]d here for SOL and NRNST in MAU  Labor: Progressing well. Will plan for AROM  Fetal Wellbeing:  Cat I Pain Control:  Epidural in place Anticipated MOD:  NSVD  Orpah Cobb, D.O. [facility], PGY1 05/18/2018, 1:14 PM

## 2018-05-18 NOTE — Anesthesia Postprocedure Evaluation (Signed)
Anesthesia Post Note  Patient: Transport planner  Procedure(s) Performed: AN AD HOC LABOR EPIDURAL     Patient location during evaluation: Mother Baby Anesthesia Type: Epidural Level of consciousness: awake and alert and oriented Pain management: satisfactory to patient Vital Signs Assessment: post-procedure vital signs reviewed and stable Respiratory status: spontaneous breathing and nonlabored ventilation Cardiovascular status: stable Postop Assessment: no headache, no backache, no signs of nausea or vomiting, adequate PO intake, patient able to bend at knees and able to ambulate (patient up walking) Anesthetic complications: no    Last Vitals:  Vitals:   05/18/18 1712 05/18/18 1900  BP: (!) 147/77 (!) 147/63  Pulse: 65 72  Resp: 16 20  Temp: 36.9 C 36.9 C  SpO2:      Last Pain:  Vitals:   05/18/18 1900  TempSrc:   PainSc: 0-No pain   Pain Goal:                   Jacksyn Beeks

## 2018-05-19 MED ORDER — IBUPROFEN 600 MG PO TABS: 600.0000 mg | ORAL_TABLET | Freq: Four times a day (QID) | ORAL | 0 refills | Status: DC

## 2018-05-19 MED ORDER — ACETAMINOPHEN 325 MG PO TABS: 650.0000 mg | ORAL_TABLET | ORAL | 0 refills | Status: DC | PRN

## 2018-05-19 NOTE — Progress Notes (Signed)
Post Partum Day 1 Subjective: no complaints, up ad lib, voiding and tolerating PO  Objective: Blood pressure 126/60, pulse 73, temperature 98.3 F (36.8 C), temperature source Oral, resp. rate 18, height 5\' 4"  (1.626 m), weight 129.2 kg, SpO2 100 %, unknown if currently breastfeeding.  Physical Exam:  General: alert, cooperative and no distress Lochia: appropriate Uterine Fundus: firm DVT Evaluation: No cords or calf tenderness. No significant calf/ankle edema.  Recent Labs    05/18/18 0819  HGB 14.2  HCT 42.2    Assessment/Plan: Plan for late afternoon vs tomorrow discharge. Breast feeding well. Contraception - OCPs. - continue routine postpartum care   LOS: 1 day    Burman Nieves, MD Family Medicine Resident  05/19/2018, 7:04 AM

## 2018-05-19 NOTE — Lactation Note (Signed)
This note was copied from a baby's chart. Lactation Consultation Note  Patient Name: Boy Faynell Churchwell PNTIR'W Date: 05/19/2018 Reason for consult: Initial assessment;Term Baby is 22 hours old/3 % weight loss.  In house Spanish interpreter used for consult.  Mom is a P2 and breastfed her first for 3 years.  She states newborn is feeding very well.  No questions or concerns.  Requests a manual pump for home use.  Manual pump given with instructions.  Instructed to offer breast with any feeding cue and call for assist prn.  Spanish breastfeeding consultation services and support information given.  Maternal Data Does the patient have breastfeeding experience prior to this delivery?: Yes  Feeding    LATCH Score                   Interventions    Lactation Tools Discussed/Used     Consult Status Consult Status: Follow-up Date: 05/20/18    Huston Foley 05/19/2018, 1:58 PM

## 2020-12-04 ENCOUNTER — Other Ambulatory Visit: Payer: Self-pay

## 2020-12-04 ENCOUNTER — Ambulatory Visit (INDEPENDENT_AMBULATORY_CARE_PROVIDER_SITE_OTHER): Payer: Self-pay | Admitting: *Deleted

## 2020-12-04 VITALS — BP 148/88 | HR 76 | Temp 98.2°F | Ht 64.0 in | Wt 316.8 lb

## 2020-12-04 DIAGNOSIS — O0990 Supervision of high risk pregnancy, unspecified, unspecified trimester: Secondary | ICD-10-CM

## 2020-12-04 DIAGNOSIS — O169 Unspecified maternal hypertension, unspecified trimester: Secondary | ICD-10-CM | POA: Insufficient documentation

## 2020-12-04 DIAGNOSIS — Z8759 Personal history of other complications of pregnancy, childbirth and the puerperium: Secondary | ICD-10-CM | POA: Insufficient documentation

## 2020-12-04 DIAGNOSIS — O99210 Obesity complicating pregnancy, unspecified trimester: Secondary | ICD-10-CM | POA: Insufficient documentation

## 2020-12-04 NOTE — Patient Instructions (Signed)
   Genetic Screening Results Information: You are having genetic testing called [facility] today.  It will take approximately 2 weeks before the results are available.  To get your results, you need Internet access to a web browser to search [facility]/MyChart (the direct app on your phone will not give you these results).  Then select Lab Scanned and click on the blue hyper link that says View Image to see your [facility] results.  You can also use the directions on the purple card given to look up your results directly on the [facility] website.  

## 2020-12-04 NOTE — Progress Notes (Signed)
   Location: [facility] Patient: clinic Provider: clinic Interpreter: Norva Pavlov ID: [id number]  PRENATAL INTAKE SUMMARY  Ms. Arnett presents today New OB Nurse Interview.  OB History     Gravida  3   Para  2   Term  2   Preterm      AB      Living  2      SAB      IAB      Ectopic      Multiple  0   Live Births  2          I have reviewed the patient's medical, obstetrical, social, and family histories, medications, and available lab results.  SUBJECTIVE She has no unusual complaints.  OBJECTIVE Initial Nurse interview for history/labs (New OB).  Today's Vitals   12/04/20 0859 12/04/20 0901  BP: (!) 153/76 (!) 148/88  Pulse: 83 76  Temp: 98.2 F (36.8 C)   Weight: (!) 316 lb 12.8 oz (143.7 kg)      ADOPT-A-MOM  EDD: 06/02/20 by LMP GA: [redacted]w[redacted]d [id number] FHT: Unable to assess fetal heart tones due to body habitus.    GENERAL APPEARANCE: alert, well appearing, in no apparent distress, oriented to person, place and time, overweight   ASSESSMENT Normal pregnancy  PLAN Prenatal care:  [facility] Pnl/HIV/Hep C OB Urine Culture GC/CT at next visit with Cleone Slim, CNM 12/13/20 PAP at [facility] for record HgbEval/SMA/CF ([facility]) [facility] A1C If CHTN - P/C Ratio and CMP Continue PNV   Follow Up Instructions:   I discussed the assessment and treatment plan with the patient. The patient was provided an opportunity to ask questions and all were answered. The patient agreed with the plan and demonstrated an understanding of the instructions.   The patient was advised to call back or seek an in-person evaluation if the symptoms worsen or if the condition fails to improve as anticipated.  I provided 45 minutes of  face-to-face time during this encounter.  Clovis Pu, RN

## 2020-12-05 LAB — COMPREHENSIVE METABOLIC PANEL
ALT: 13 IU/L (ref 0–32)
AST: 12 IU/L (ref 0–40)
Albumin/Globulin Ratio: 1.6 (ref 1.2–2.2)
Albumin: 4.1 g/dL (ref 3.9–5.0)
Alkaline Phosphatase: 66 IU/L (ref 44–121)
BUN/Creatinine Ratio: 11 (ref 9–23)
BUN: 9 mg/dL (ref 6–20)
Bilirubin Total: <0.2 mg/dL (ref 0.0–1.2)
CO2: 19 mmol/L — ABNORMAL LOW (ref 20–29)
Calcium: 9.1 mg/dL (ref 8.7–10.2)
Chloride: 102 mmol/L (ref 96–106)
Creatinine, Ser: 0.82 mg/dL (ref 0.57–1.00)
Globulin, Total: 2.6 g/dL (ref 1.5–4.5)
Glucose: 82 mg/dL (ref 70–99)
Potassium: 4.4 mmol/L (ref 3.5–5.2)
Sodium: 135 mmol/L (ref 134–144)
Total Protein: 6.7 g/dL (ref 6.0–8.5)
eGFR: 101 mL/min/{1.73_m2} (ref >59)

## 2020-12-05 LAB — PROTEIN / CREATININE RATIO, URINE
Creatinine, Urine: 136.2 mg/dL
Protein, Ur: 8.4 mg/dL
Protein/Creat Ratio: 62 mg/g creat (ref 0–200)

## 2020-12-05 LAB — OBSTETRIC PANEL, INCLUDING HIV
Antibody Screen: NEGATIVE
Basophils Absolute: 0 10*3/uL (ref 0.0–0.2)
Basos: 0 %
EOS (ABSOLUTE): 0.1 10*3/uL (ref 0.0–0.4)
Eos: 1 %
HIV Screen 4th Generation wRfx: NONREACTIVE
Hematocrit: 38.6 % (ref 34.0–46.6)
Hemoglobin: 13.3 g/dL (ref 11.1–15.9)
Hepatitis B Surface Ag: NEGATIVE
Immature Grans (Abs): 0 10*3/uL (ref 0.0–0.1)
Immature Granulocytes: 0 %
Lymphocytes Absolute: 1.9 10*3/uL (ref 0.7–3.1)
Lymphs: 22 %
MCH: 28.7 pg (ref 26.6–33.0)
MCHC: 34.5 g/dL (ref 31.5–35.7)
MCV: 83 fL (ref 79–97)
Monocytes Absolute: 0.5 10*3/uL (ref 0.1–0.9)
Monocytes: 6 %
Neutrophils Absolute: 6 10*3/uL (ref 1.4–7.0)
Neutrophils: 71 %
Platelets: 274 10*3/uL (ref 150–450)
RBC: 4.63 x10E6/uL (ref 3.77–5.28)
RDW: 13.3 % (ref 11.7–15.4)
RPR Ser Ql: NONREACTIVE
Rh Factor: POSITIVE
Rubella Antibodies, IGG: 1.76 index (ref >0.99)
WBC: 8.6 10*3/uL (ref 3.4–10.8)

## 2020-12-05 LAB — HEMOGLOBIN A1C
Est. average glucose Bld gHb Est-mCnc: 111 mg/dL
Hgb A1c MFr Bld: 5.5 % (ref 4.8–5.6)

## 2020-12-06 LAB — CULTURE, OB URINE

## 2020-12-06 LAB — URINE CULTURE, OB REFLEX

## 2020-12-13 ENCOUNTER — Ambulatory Visit (INDEPENDENT_AMBULATORY_CARE_PROVIDER_SITE_OTHER): Payer: Self-pay

## 2020-12-13 ENCOUNTER — Other Ambulatory Visit (HOSPITAL_COMMUNITY)
Admission: RE | Admit: 2020-12-13 | Discharge: 2020-12-13 | Disposition: A | Discharge status: Home / Self Care | Payer: Self-pay | Source: Ambulatory Visit

## 2020-12-13 ENCOUNTER — Other Ambulatory Visit: Payer: Self-pay

## 2020-12-13 VITALS — BP 144/80 | HR 73 | Temp 97.6°F | Wt 311.8 lb

## 2020-12-13 DIAGNOSIS — O0990 Supervision of high risk pregnancy, unspecified, unspecified trimester: Secondary | ICD-10-CM

## 2020-12-13 DIAGNOSIS — Z23 Encounter for immunization: Secondary | ICD-10-CM

## 2020-12-13 DIAGNOSIS — Z3A15 15 weeks gestation of pregnancy: Secondary | ICD-10-CM

## 2020-12-13 DIAGNOSIS — O99210 Obesity complicating pregnancy, unspecified trimester: Secondary | ICD-10-CM | POA: Insufficient documentation

## 2020-12-13 DIAGNOSIS — O10919 Unspecified pre-existing hypertension complicating pregnancy, unspecified trimester: Secondary | ICD-10-CM

## 2020-12-13 DIAGNOSIS — O169 Unspecified maternal hypertension, unspecified trimester: Secondary | ICD-10-CM

## 2020-12-13 MED ORDER — PRENATAL PLUS 27-1 MG PO TABS: 1.0000 | ORAL_TABLET | Freq: Every day | ORAL | 0 refills | Status: DC

## 2020-12-13 MED ORDER — INFLUENZA VAC SPLIT QUAD 0.5 ML IM SUSP: 0.5000 mL | Freq: Once | INTRAMUSCULAR | 0 refills | Status: AC

## 2020-12-13 NOTE — Progress Notes (Signed)
Subjective:   Sabrina Riley is a 25 y.o. G3P2002 at [redacted]w[redacted]d by LMP being seen today for her first obstetrical visit.  Her obstetrical history is significant for  chronic hypertension  and has Non-reactive NST (non-stress test); Supervision of high risk pregnancy, antepartum; Obesity affecting pregnancy, antepartum; Elevated blood pressure affecting pregnancy, antepartum; and History of gestational hypertension on their problem list.. Patient does intend to breast feed. Pregnancy history fully reviewed.  Patient reports no complaints.  HISTORY: OB History  Gravida Para Term Preterm AB Living  3 2 2  0 0 2  SAB IAB Ectopic Multiple Live Births  0 0 0 0 2    # Outcome Date GA Lbr Len/2nd Weight Sex Delivery Anes PTL Lv  3 Current           2 Term 05/18/18 [redacted]w[redacted]d 12:02 / 00:11 8 lb 5.9 oz (3.795 kg) M Vag-Spont EPI  LIV     Name: JET, TRAYNHAM     Apgar1: 8  Apgar5: 9  1 Term 10/15/14 [redacted]w[redacted]d 11:31 / 01:41 7 lb 15.7 oz (3.62 kg) F Vag-Spont EPI  LIV     Name: LOZANO-OLVERA,GIRL [place]     Apgar1: 9  Apgar5: 9   Past Medical History:  Diagnosis Date   Asthma    History of gestational hypertension 10/14/2014   Hypertension    Labor and delivery indication for care or intervention 05/18/2018   Morbid obesity (HCC)    Morbid obesity (HCC) 05/18/2018   Past Surgical History:  Procedure Laterality Date   NO PAST SURGERIES     Family History  Problem Relation Age of Onset   Heart disease Father    Diabetes Father    Thyroid disease Sister    Social History   Tobacco Use   Smoking status: Never    Passive exposure: Never   Smokeless tobacco: Never  Vaping Use   Vaping Use: Never used  Substance Use Topics   Alcohol use: Never   Drug use: Never   No Known Allergies No current outpatient medications on file prior to visit.   No current facility-administered medications on file prior to visit.     Exam   Vitals:   12/13/20 1025  BP: (!) 144/80   Pulse: 73  Temp: 97.6 F (36.4 C)  Weight: (!) 311 lb 12.8 oz (141.4 kg)      Uterus:     Pelvic Exam: Perineum: no hemorrhoids, normal perineum   Vulva: normal external genitalia, no lesions   Vagina:  normal mucosa, normal discharge   Cervix: no lesions and normal, pap smear done.    Adnexa: normal adnexa and no mass, fullness, tenderness   Bony Pelvis: average  System: General: well-developed, well-nourished female in no acute distress   Breast:  normal appearance, no masses or tenderness   Skin: normal coloration and turgor, no rashes   Neurologic: oriented, normal, negative, normal mood   Extremities: normal strength, tone, and muscle mass, ROM of all joints is normal   HEENT PERRLA, extraocular movement intact and sclera clear, anicteric   Mouth/Teeth mucous membranes moist, pharynx normal without lesions and dental hygiene good   Neck supple and no masses   Cardiovascular: regular rate and rhythm   Respiratory:  no respiratory distress, normal breath sounds   Abdomen: soft, non-tender; bowel sounds normal; no masses,  no organomegaly     Assessment:   Pregnancy: 12/15/20 Patient Active Problem List   Diagnosis Date Noted   Supervision  of high risk pregnancy, antepartum 12/04/2020   Obesity affecting pregnancy, antepartum 12/04/2020   Elevated blood pressure affecting pregnancy, antepartum 12/04/2020   History of gestational hypertension 12/04/2020   Non-reactive NST (non-stress test) 05/18/2018     Plan:  1. Flu vaccine need - influenza vac split quadrivalent PF (FLUARIX) 0.5 ML injection; Inject 0.5 mLs into the muscle once for 1 dose.  Dispense: 0.5 mL; Refill: 0  2. Supervision of high risk pregnancy, antepartum  -Elevated BPs at intake and new OB. Discussed diagnosis of CHTN and implications in pregnancy. Patient states she checks her BP daily at home and is normotensive. Encouraged patient to bring cuff to next visit to check with office cuff. Reivewed how  [facility] management does not change in pregnancy unless uncontrolled or developed preeclampsia. Discussed IOL at 40 weeks if well controlled.  -Order sent to [facility] for detailed anatomy -NIPS normal. Patient requested gender in envelope for gender reveal  - influenza vac split quadrivalent PF (FLUARIX) 0.5 ML injection; Inject 0.5 mLs into the muscle once for 1 dose.  Dispense: 0.5 mL; Refill: 0 - prenatal vitamin w/FE, FA (PRENATAL 1 + 1) 27-1 MG TABS tablet; Take 1 tablet by mouth daily at 12 noon.  Dispense: 30 tablet; Refill: 0 - Urine cytology ancillary only([place]) - AFP, Serum, Open Spina Bifida - Hepatitis C antibody  3. [redacted] weeks gestation of pregnancy -Unable to doppler. Bedside ultrasound used to confirm FHT of 156 bpm with active fetus  4. Obesity affecting pregnancy, antepartum  5. Elevated blood pressure affecting pregnancy, antepartum -CHTN, no meds at this time   Initial labs drawn. Continue prenatal vitamins. Genetic Screening discussed, NIPS normal, AFP today. Ultrasound discussed; fetal anatomic survey: ordered. Problem list reviewed and updated. The nature of [facility] with multiple MDs and other Advanced Practice Providers was explained to patient; also emphasized that residents, students are part of our team. Routine obstetric precautions reviewed. Return in about 6 weeks (around 01/24/2021) for Return OB visit.   Rolm Bookbinder, CNM 12/13/20 10:54 AM

## 2020-12-13 NOTE — Patient Instructions (Signed)

## 2020-12-14 LAB — URINE CYTOLOGY ANCILLARY ONLY
Chlamydia: NEGATIVE
Comment: NEGATIVE
Comment: NEGATIVE
Comment: NORMAL
Neisseria Gonorrhea: NEGATIVE
Trichomonas: NEGATIVE

## 2020-12-14 LAB — HEPATITIS C ANTIBODY: Hep C Virus Ab: <0.1 s/co ratio (ref 0.0–0.9)

## 2020-12-16 LAB — AFP, SERUM, OPEN SPINA BIFIDA
AFP MoM: 0.6
AFP Value: 12.1 ng/mL
Gest. Age on Collection Date: 15 weeks
Maternal Age At EDD: 26.2 yr
OSBR Risk 1 IN: 10000
Test Results:: NEGATIVE
Weight: 311 [lb_av]

## 2021-01-16 ENCOUNTER — Ambulatory Visit (INDEPENDENT_AMBULATORY_CARE_PROVIDER_SITE_OTHER): Payer: Self-pay | Admitting: Medical

## 2021-01-16 ENCOUNTER — Other Ambulatory Visit: Payer: Self-pay

## 2021-01-16 ENCOUNTER — Encounter: Payer: Self-pay | Admitting: Medical

## 2021-01-16 VITALS — BP 130/75 | HR 88 | Temp 98.1°F | Wt 315.6 lb

## 2021-01-16 DIAGNOSIS — O10919 Unspecified pre-existing hypertension complicating pregnancy, unspecified trimester: Secondary | ICD-10-CM

## 2021-01-16 DIAGNOSIS — Z3A16 16 weeks gestation of pregnancy: Secondary | ICD-10-CM

## 2021-01-16 DIAGNOSIS — O99210 Obesity complicating pregnancy, unspecified trimester: Secondary | ICD-10-CM

## 2021-01-16 DIAGNOSIS — O0990 Supervision of high risk pregnancy, unspecified, unspecified trimester: Secondary | ICD-10-CM

## 2021-01-16 MED ORDER — ASPIRIN EC 81 MG PO TBEC: 81.0000 mg | DELAYED_RELEASE_TABLET | Freq: Every day | ORAL | 11 refills | Status: DC

## 2021-01-16 NOTE — Progress Notes (Signed)
   PRENATAL VISIT NOTE  Subjective:  Sabrina Riley is a 25 y.o. G3P2002 at [redacted]w[redacted]d being seen today for ongoing prenatal care.  She is currently monitored for the following issues for this high-risk pregnancy and has Supervision of high risk pregnancy, antepartum; Obesity affecting pregnancy, antepartum; Elevated blood pressure affecting pregnancy, antepartum; History of gestational hypertension; and Chronic hypertension affecting pregnancy on their problem list.  Patient reports no complaints.  Contractions: Not present. Vag. Bleeding: None.  Movement: Present. Denies leaking of fluid.   The following portions of the patient's history were reviewed and updated as appropriate: allergies, current medications, past family history, past medical history, past social history, past surgical history and problem list.   Objective:   Vitals:   01/16/21 1514  BP: 130/75  Pulse: 88  Temp: 98.1 F (36.7 C)  Weight: (!) 315 lb 9.6 oz (143.2 kg)    Fetal Status: Fetal Heart Rate (bpm): 150   Movement: Present     General:  Alert, oriented and cooperative. Patient is in no acute distress.  Skin: Skin is warm and dry. No rash noted.   Cardiovascular: Normal heart rate noted  Respiratory: Normal respiratory effort, no problems with respiration noted  Abdomen: Soft, gravid, appropriate for gestational age.  Pain/Pressure: Absent     Pelvic: Cervical exam deferred        Extremities: Normal range of motion.  Edema: None  Mental Status: Normal mood and affect. Normal behavior. Normal judgment and thought content.   Assessment and Plan:  Pregnancy: G3P2002 at [redacted]w[redacted]d 1. Supervision of high risk pregnancy, antepartum - Due date changed based on [facility] [place] from 01/08/21 - Patient will need repeat US due to early gestation and changed due date to confirm EFW and dating - EFW noted at 3%, follow-up scheduled in 3-4 weeks   2. Obesity affecting pregnancy, antepartum - aspirin EC 81 MG tablet;  Take 1 tablet (81 mg total) by mouth daily. Swallow whole.  Dispense: 30 tablet; Refill: 11  3. Chronic hypertension affecting pregnancy - aspirin EC 81 MG tablet; Take 1 tablet (81 mg total) by mouth daily. Swallow whole.  Dispense: 30 tablet; Refill: 11 - Normotensive today, will continue to monitor - No meds currently - Patient has BP cuff at home, advised to check weekly and report values > 160/105 immediately   4. [redacted] weeks gestation of pregnancy  Preterm labor symptoms and general obstetric precautions including but not limited to vaginal bleeding, contractions, leaking of fluid and fetal movement were reviewed in detail with the patient. Please refer to After Visit Summary for other counseling recommendations.   Return in about 4 weeks (around 02/13/2021) for [facility] APP, In-Person.  Future Appointments  Date Time Provider Department Center  02/08/2021 10:15 AM Raelyn Mora, CNM [facility] None  03/15/2021  8:15 AM Raelyn Mora, CNM [facility] None  04/12/2021  8:55 AM Raelyn Mora, CNM [facility] None    Vonzella Nipple, PA-C

## 2021-02-08 ENCOUNTER — Ambulatory Visit (INDEPENDENT_AMBULATORY_CARE_PROVIDER_SITE_OTHER): Payer: Self-pay | Admitting: Obstetrics and Gynecology

## 2021-02-08 ENCOUNTER — Other Ambulatory Visit: Payer: Self-pay

## 2021-02-08 ENCOUNTER — Encounter: Payer: Self-pay | Admitting: Obstetrics and Gynecology

## 2021-02-08 VITALS — BP 109/65 | HR 78 | Temp 97.9°F | Wt 312.8 lb

## 2021-02-08 DIAGNOSIS — O99210 Obesity complicating pregnancy, unspecified trimester: Secondary | ICD-10-CM

## 2021-02-08 DIAGNOSIS — Z3A19 19 weeks gestation of pregnancy: Secondary | ICD-10-CM

## 2021-02-08 DIAGNOSIS — Z789 Other specified health status: Secondary | ICD-10-CM

## 2021-02-08 DIAGNOSIS — O169 Unspecified maternal hypertension, unspecified trimester: Secondary | ICD-10-CM

## 2021-02-08 DIAGNOSIS — O0990 Supervision of high risk pregnancy, unspecified, unspecified trimester: Secondary | ICD-10-CM

## 2021-02-08 NOTE — Progress Notes (Signed)
[facility]NANC[facility] OFF[facility] VISIT Patient name: Sabrina Riley MRN [id number]  Date of birth: Jul 02, 1995 Chief Complaint:   Routine Prenatal Visit  History of Present Illness:   Sabrina Riley is a 25 y.o. G63P2002 female at [redacted]w[redacted]d with an Estimated Date of Delivery: 06/30/21 being seen today for ongoing management of a high-risk pregnancy complicated by [facility] currently on no meds Today she reports no complaints. Contractions: Not present. Vag. Bleeding: None.  Movement: Present. denies leaking of fluid.  Review of Systems:   Pertinent items are noted in HPI Denies abnormal vaginal discharge w/ itching/odor/irritation, headaches, visual changes, shortness of breath, chest pain, abdominal pain, severe nausea/vomiting, or problems with urination or bowel movements unless otherwise stated above. Pertinent History Reviewed:  Reviewed past medical,surgical, social, obstetrical and family history.  Reviewed problem list, medications and allergies. Physical Assessment:   Vitals:   02/08/21 1019  BP: 109/65  Pulse: 78  Temp: 97.9 F (36.6 C)  Weight: (!) 312 lb 12.8 oz (141.9 kg)  Body mass index is 53.69 kg/m.           Physical Examination:   General appearance: alert, well appearing, and in no distress, oriented to person, place, and time, normal appearing weight, and overweight  Mental status: alert, oriented to person, place, and time, normal mood, behavior, speech, dress, motor activity, and thought processes  Skin: warm & dry   Extremities: Edema: None    Cardiovascular: normal heart rate noted  Respiratory: normal respiratory effort, no distress  Abdomen: gravid, soft, non-tender  Pelvic: Cervical exam deferred         Fetal Status: Fetal Heart Rate (bpm): 145   Movement: Present    Fetal Surveillance Testing today: none   Patient informed that the ultrasound is considered a limited OB ultrasound and is not intended to be a complete ultrasound exam.  Patient also  informed that the ultrasound is not being completed with the intent of assessing for fetal or placental anomalies or any pelvic abnormalities.  Explained that the purpose of todays ultrasound is to assess for viability.  Cardiac activity visualized in LT lower pelvis. Baby was found to be in a breech presentation. Patient acknowledges the purpose of the exam and the limitations of the study.    Assessment & Plan:  1) High-risk pregnancy G3P2002 at [redacted]w[redacted]d with an Estimated Date of Delivery: 06/30/21   2) Supervision of high risk pregnancy, antepartum - Repeat U/S scheduled for 02/12/2021  3) Obesity affecting pregnancy, antepartum - Difficulty obtaining FHT with doppler alone -- visualized location of cardiac activity using BS U/S, then able to hear with doppler.  4) Elevated blood pressure affecting pregnancy, antepartum - BP good today - Patient reports BPs at home no higher than 138 for top number  5) [redacted] weeks gestation of pregnancy   6) Language barrier affecting health care  - [employer] Video Spanish Interpreter, Seth Bake [id number] used for entire visit   Meds: No orders of the defined types were placed in this encounter.   Labs/procedures today: none  Treatment Plan:  no changes  Reviewed: Preterm labor symptoms and general obstetric precautions including but not limited to vaginal bleeding, contractions, leaking of fluid and fetal movement were reviewed in detail with the patient.  All questions were answered. Has home bp cuff. Check bp weekly, let us know if >140/90.   Follow-up: Return in about 4 weeks (around 03/08/2021) for Return OB visit.  No orders of the defined types were placed in this  encounter.  Raelyn Mora MSN, CNM 02/08/2021 12:12 PM

## 2021-02-25 NOTE — L&D Delivery Note (Addendum)
?  OB/GYN Faculty Practice Delivery Note ? ?Sabrina Riley is a 26 y.o. [id number] s/p VD at [redacted]w[redacted]d. She was admitted for IOL s/t CHTN with no meds.  ? ?ROM: 3h 64m with Clear fluid ?GBS Status: Negative ?Maximum Maternal Temperature: 98.2 ? ?Labor Progress: ?Patient presented for IOL and given cytotec x 3 before AROM performed.  Pitocin initiated shortly after, but discontinued shortly after with Cat II FT.  She received terbutaline, but progressed to complete and delivered as below with staff and husband support.  ? ?Delivery Date/Time: June 23, 2021 at 2112 ?Delivery: Called to room for patient c/o rectal pressure. Exam revealed complete dilation with +2 fetal station.  Patient instructed to push and head delivered in ROA position with compound Right Hand. No nuchal cord present. Shoulder and body delivered in usual fashion. Infant with spontaneous cry and dried and stimulated by provider before being placed on mother's abdomen.  Nurse's continued to dry and stimulate infant. Cord clamped x 2 after 3-minute delay, and cut by Father-Sabrina Riley. Cord blood drawn. Placenta delivered spontaneously, via [facility], with gentle cord traction. Labia, perineum, vagina, and cervix inspected and found to be without lacerations. Fundus firm with massage and Pitocin. Infant skin to skin and mother stable prior to provider exit.   ? ?Infant: Female-Sabrina Riley  APGARs 9, 9  3790g 8lbs 5.7oz, 20in ? ?Placenta: Intact, Disposal ?Complications: None ?Lacerations: None ?EBL: 200 ?Analgesia: Epidural ? ?Postpartum Planning: ?*No circumcision ?*Planning Outpatient IUD ?*Follow up per [facility] Guidelines ? ?Cherre Robins, CNM  ?06/23/2021 11:09 PM ? ?

## 2021-03-08 ENCOUNTER — Encounter: Payer: Self-pay | Admitting: Obstetrics and Gynecology

## 2021-03-08 ENCOUNTER — Other Ambulatory Visit: Payer: Self-pay

## 2021-03-08 ENCOUNTER — Ambulatory Visit (INDEPENDENT_AMBULATORY_CARE_PROVIDER_SITE_OTHER): Payer: Self-pay | Admitting: Obstetrics and Gynecology

## 2021-03-08 VITALS — BP 134/69 | HR 74 | Temp 98.1°F | Wt 312.0 lb

## 2021-03-08 DIAGNOSIS — O10919 Unspecified pre-existing hypertension complicating pregnancy, unspecified trimester: Secondary | ICD-10-CM

## 2021-03-08 DIAGNOSIS — O26842 Uterine size-date discrepancy, second trimester: Secondary | ICD-10-CM

## 2021-03-08 DIAGNOSIS — Z789 Other specified health status: Secondary | ICD-10-CM

## 2021-03-08 DIAGNOSIS — O99210 Obesity complicating pregnancy, unspecified trimester: Secondary | ICD-10-CM

## 2021-03-08 DIAGNOSIS — O0990 Supervision of high risk pregnancy, unspecified, unspecified trimester: Secondary | ICD-10-CM

## 2021-03-08 DIAGNOSIS — Z3A23 23 weeks gestation of pregnancy: Secondary | ICD-10-CM

## 2021-03-08 NOTE — Progress Notes (Signed)
°  HIGH-RISK PREGNANCY OFFICE VISIT Patient name: Sabrina Riley MRN [id number]  Date of birth: June 13, 1995 Chief Complaint:   Routine Prenatal Visit  History of Present Illness:   Sabrina Riley is a 26 y.o. G10P2002 female at [redacted]w[redacted]d with an Estimated Date of Delivery: 06/30/21 being seen today for ongoing management of a high-risk pregnancy complicated by obesity and CHTN currently on no medication Today she reports no complaints. Contractions: Not present. Vag. Bleeding: None.  Movement: Present. denies leaking of fluid.  Review of Systems:   Pertinent items are noted in HPI Denies abnormal vaginal discharge w/ itching/odor/irritation, headaches, visual changes, shortness of breath, chest pain, abdominal pain, severe nausea/vomiting, or problems with urination or bowel movements unless otherwise stated above. Pertinent History Reviewed:  Reviewed past medical,surgical, social, obstetrical and family history.  Reviewed problem list, medications and allergies. Physical Assessment:   Vitals:   03/08/21 1022  BP: 134/69  Pulse: 74  Temp: 98.1 F (36.7 C)  Weight: (!) 312 lb (141.5 kg)  Body mass index is 53.55 kg/m.           Physical Examination:   General appearance: alert, well appearing, and in no distress, oriented to person, place, and time, and overweight  Mental status: alert, oriented to person, place, and time, normal mood, behavior, speech, dress, motor activity, and thought processes  Skin: warm & dry   Extremities: Edema: None    Cardiovascular: normal heart rate noted  Respiratory: normal respiratory effort, no distress  Abdomen: gravid, soft, non-tender  Pelvic: Cervical exam deferred         Fetal Status: Fetal Heart Rate (bpm): 145 Fundal Height: 28 cm Movement: Present    Fetal Surveillance Testing today: none   No results found for this or any previous visit (from the past 24 hour(s)).  Assessment & Plan:  1) High-risk pregnancy G3P2002 at [redacted]w[redacted]d with  an Estimated Date of Delivery: 06/30/21   2) Supervision of high risk pregnancy, antepartum  3) Obesity affecting pregnancy, antepartum - Taking daily bASA - Plan growth [place]  4) Chronic hypertension affecting pregnancy - BP stable  5) Language barrier affecting health care - [employer] Video Spanish Interpreter, Fuller [place] [id number] used for entire visit   6) [redacted] weeks gestation of pregnancy  7) Uterine size date discrepancy pregnancy, second trimester  - Plan growth [place]  Meds: No orders of the defined types were placed in this encounter.   Labs/procedures today: none  Treatment Plan:  growth [place]  Reviewed: Preterm labor symptoms and general obstetric precautions including but not limited to vaginal bleeding, contractions, leaking of fluid and fetal movement were reviewed in detail with the patient.  All questions were answered.   Follow-up: Return in about 4 weeks (around 04/05/2021) for Return OB visit.  No orders of the defined types were placed in this encounter.  Raelyn Mora MSN, CNM 03/08/2021 11:00 AM

## 2021-03-15 ENCOUNTER — Encounter: Payer: Self-pay | Admitting: Obstetrics and Gynecology

## 2021-03-19 ENCOUNTER — Other Ambulatory Visit: Payer: Self-pay

## 2021-03-19 ENCOUNTER — Ambulatory Visit (INDEPENDENT_AMBULATORY_CARE_PROVIDER_SITE_OTHER): Payer: Self-pay | Admitting: *Deleted

## 2021-03-19 ENCOUNTER — Other Ambulatory Visit (HOSPITAL_COMMUNITY)
Admission: RE | Admit: 2021-03-19 | Discharge: 2021-03-19 | Disposition: A | Discharge status: Home / Self Care | Payer: Self-pay | Source: Ambulatory Visit | Attending: Obstetrics and Gynecology | Admitting: Obstetrics and Gynecology

## 2021-03-19 VITALS — BP 118/70 | HR 79 | Temp 97.5°F | Wt 311.6 lb

## 2021-03-19 DIAGNOSIS — O26899 Other specified pregnancy related conditions, unspecified trimester: Secondary | ICD-10-CM

## 2021-03-19 DIAGNOSIS — N898 Other specified noninflammatory disorders of vagina: Secondary | ICD-10-CM | POA: Insufficient documentation

## 2021-03-19 DIAGNOSIS — O169 Unspecified maternal hypertension, unspecified trimester: Secondary | ICD-10-CM

## 2021-03-19 DIAGNOSIS — Z8759 Personal history of other complications of pregnancy, childbirth and the puerperium: Secondary | ICD-10-CM

## 2021-03-19 DIAGNOSIS — R102 Pelvic and perineal pain: Secondary | ICD-10-CM

## 2021-03-19 DIAGNOSIS — O0990 Supervision of high risk pregnancy, unspecified, unspecified trimester: Secondary | ICD-10-CM

## 2021-03-19 DIAGNOSIS — O99210 Obesity complicating pregnancy, unspecified trimester: Secondary | ICD-10-CM

## 2021-03-19 NOTE — Progress Notes (Signed)
° °  SUBJECTIVE:  26 y.o. female complains of green vaginal discharge for 1 day(s). Denies abnormal vaginal bleeding or significant pelvic pain or fever. No UTI symptoms. Denies history of known exposure to STD.  Patient's last menstrual period was 08/26/2020 (approximate).  OBJECTIVE:  She appears well, afebrile. Urine dipstick: not done.  ASSESSMENT:  Vaginal Discharge  Vaginal pain   PLAN:  GC, chlamydia, trichomonas, BVAG, CVAG probe sent to lab. Treatment: To be determined once lab results are received ROV prn if symptoms persist or worsen.   Advised patient she should be seen at [facility] for her vaginal pain.  AM[employer] interpreter: Cori Razor ID [id number]  Derl Barrow, RN

## 2021-03-20 LAB — CERVICOVAGINAL ANCILLARY ONLY
Bacterial Vaginitis (gardnerella): NEGATIVE
Candida Glabrata: NEGATIVE
Candida Vaginitis: NEGATIVE
Chlamydia: NEGATIVE
Comment: NEGATIVE
Comment: NEGATIVE
Comment: NEGATIVE
Comment: NEGATIVE
Comment: NEGATIVE
Comment: NORMAL
Neisseria Gonorrhea: NEGATIVE
Trichomonas: NEGATIVE

## 2021-04-12 ENCOUNTER — Encounter: Payer: Self-pay | Admitting: Obstetrics and Gynecology

## 2021-04-13 ENCOUNTER — Telehealth: Payer: Self-pay | Admitting: Certified Nurse Midwife

## 2021-04-13 NOTE — Telephone Encounter (Signed)
Attempted to reach patient with an Interpreter to inform her of an appointment at the [facility]. Left a message for her to call the office.

## 2021-04-23 ENCOUNTER — Ambulatory Visit (INDEPENDENT_AMBULATORY_CARE_PROVIDER_SITE_OTHER): Payer: Self-pay | Admitting: Nurse Practitioner

## 2021-04-23 ENCOUNTER — Other Ambulatory Visit: Payer: Self-pay

## 2021-04-23 ENCOUNTER — Telehealth: Payer: Self-pay | Admitting: Certified Nurse Midwife

## 2021-04-23 ENCOUNTER — Telehealth: Payer: Self-pay | Admitting: Nurse Practitioner

## 2021-04-23 VITALS — BP 104/60 | HR 83 | Wt 315.1 lb

## 2021-04-23 DIAGNOSIS — O99210 Obesity complicating pregnancy, unspecified trimester: Secondary | ICD-10-CM

## 2021-04-23 DIAGNOSIS — Z3A30 30 weeks gestation of pregnancy: Secondary | ICD-10-CM

## 2021-04-23 DIAGNOSIS — Z8759 Personal history of other complications of pregnancy, childbirth and the puerperium: Secondary | ICD-10-CM

## 2021-04-23 DIAGNOSIS — O0990 Supervision of high risk pregnancy, unspecified, unspecified trimester: Secondary | ICD-10-CM

## 2021-04-23 DIAGNOSIS — O10919 Unspecified pre-existing hypertension complicating pregnancy, unspecified trimester: Secondary | ICD-10-CM

## 2021-04-23 NOTE — Telephone Encounter (Signed)
Attempted to reach patient about coming in the office to do a glucose test. Left a voicemail message for her to call the office.

## 2021-04-23 NOTE — Progress Notes (Signed)
° ° °  Subjective:  Sabrina Riley is a 26 y.o. G3P2002 at [redacted]w[redacted]d being seen today for ongoing prenatal care.  She is currently monitored for the following issues for this high-risk pregnancy and has Supervision of high risk pregnancy, antepartum; Obesity affecting pregnancy, antepartum; Elevated blood pressure affecting pregnancy, antepartum; History of gestational hypertension; and Chronic hypertension affecting pregnancy on their problem list.  Patient reports no complaints.  Contractions: Not present. Vag. Bleeding: None.  Movement: Present. Denies leaking of fluid.   The following portions of the patient's history were reviewed and updated as appropriate: allergies, current medications, past family history, past medical history, past social history, past surgical history and problem list. Problem list updated.  Objective:   Vitals:   04/23/21 1058  BP: 104/60  Pulse: 83  Weight: (!) 315 lb 1.6 oz (142.9 kg)    Fetal Status: Fetal Heart Rate (bpm): 145   Movement: Present     General:  Alert, oriented and cooperative. Patient is in no acute distress.  Skin: Skin is warm and dry. No rash noted.   Cardiovascular: Normal heart rate noted  Respiratory: Normal respiratory effort, no problems with respiration noted  Abdomen: Soft, gravid, appropriate for gestational age. Pain/Pressure: Present     Pelvic:  Cervical exam deferred        Extremities: Normal range of motion.  Edema: None  Mental Status: Normal mood and affect. Normal behavior. Normal judgment and thought content.   Urinalysis:      Assessment and Plan:  Pregnancy: G3P2002 at [redacted]w[redacted]d  1. Supervision of high risk pregnancy, antepartum Transfer from [place] Has had anatomy US at [facility] and record reviewed on the chart Needs glucola scheduled ASAP Martin Majestic to [employer] today  2. History of gestational hypertension Taking low dose aspirin Is not on any other BP meds at this time Normal BP  today  3. Chronic hypertension affecting pregnancy   4. Obesity affecting pregnancy, antepartum Prepregnancy BMI 53  5. [redacted] weeks gestation of pregnancy   Preterm labor symptoms and general obstetric precautions including but not limited to vaginal bleeding, contractions, leaking of fluid and fetal movement were reviewed in detail with the patient. Please refer to After Visit Summary for other counseling recommendations.  Return in 2 weeks (on 05/07/2021) for schedule 2 hr glucola ASAP and ROB in 2 weeks.  Earlie Server, RN, MSN, NP-BC Nurse Practitioner, [facility] for [employer], [place] [facility] 04/23/2021 11:23 AM

## 2021-04-23 NOTE — Telephone Encounter (Signed)
Spoke with patient about going to her appointment at [facility]. Explained to her she needed to get her glucose test done. Spoke to [place] about this patient, and she will send message to clinical staff.

## 2021-04-24 ENCOUNTER — Other Ambulatory Visit: Payer: Self-pay

## 2021-04-24 DIAGNOSIS — O0990 Supervision of high risk pregnancy, unspecified, unspecified trimester: Secondary | ICD-10-CM

## 2021-04-25 LAB — GLUCOSE TOLERANCE, 2 HOURS W/ 1HR
Glucose, 1 hour: 137 mg/dL (ref 70–179)
Glucose, 2 hour: 75 mg/dL (ref 70–152)
Glucose, Fasting: 82 mg/dL (ref 70–91)

## 2021-04-25 LAB — CBC
Hematocrit: 38.4 % (ref 34.0–46.6)
Hemoglobin: 13 g/dL (ref 11.1–15.9)
MCH: 28.9 pg (ref 26.6–33.0)
MCHC: 33.9 g/dL (ref 31.5–35.7)
MCV: 85 fL (ref 79–97)
Platelets: 245 10*3/uL (ref 150–450)
RBC: 4.5 x10E6/uL (ref 3.77–5.28)
RDW: 13.8 % (ref 11.7–15.4)
WBC: 8.5 10*3/uL (ref 3.4–10.8)

## 2021-04-25 LAB — HIV ANTIBODY (ROUTINE TESTING W REFLEX): HIV Screen 4th Generation wRfx: NONREACTIVE

## 2021-04-25 LAB — RPR: RPR Ser Ql: NONREACTIVE

## 2021-05-10 ENCOUNTER — Encounter: Payer: Self-pay | Admitting: Obstetrics and Gynecology

## 2021-05-11 ENCOUNTER — Other Ambulatory Visit: Payer: Self-pay

## 2021-05-11 ENCOUNTER — Ambulatory Visit (INDEPENDENT_AMBULATORY_CARE_PROVIDER_SITE_OTHER): Payer: Self-pay | Admitting: Obstetrics & Gynecology

## 2021-05-11 ENCOUNTER — Encounter: Payer: Self-pay | Admitting: Obstetrics & Gynecology

## 2021-05-11 VITALS — BP 116/61 | HR 79 | Wt 315.0 lb

## 2021-05-11 DIAGNOSIS — Z3A32 32 weeks gestation of pregnancy: Secondary | ICD-10-CM

## 2021-05-11 DIAGNOSIS — O0990 Supervision of high risk pregnancy, unspecified, unspecified trimester: Secondary | ICD-10-CM

## 2021-05-11 DIAGNOSIS — O10919 Unspecified pre-existing hypertension complicating pregnancy, unspecified trimester: Secondary | ICD-10-CM

## 2021-05-11 NOTE — Progress Notes (Signed)
? ?  PRENATAL VISIT NOTE ? ?Subjective:  ?Sabrina Riley is a 26 y.o. G3P2002 at [redacted]w[redacted]d being seen today for ongoing prenatal care.  Patient is Spanish-speaking only, interpreter present for this encounter. She is currently monitored for the following issues for this high-risk pregnancy and has Supervision of high risk pregnancy, antepartum; Maternal morbid obesity, antepartum ([place]); and Chronic hypertension affecting pregnancy on their problem list. ? ?Patient reports no complaints.  Contractions: Not present. Vag. Bleeding: None.  Movement: Present. Denies leaking of fluid.  ? ?The following portions of the patient's history were reviewed and updated as appropriate: allergies, current medications, past family history, past medical history, past social history, past surgical history and problem list.  ? ?Objective:  ? ?Vitals:  ? 05/11/21 1125  ?BP: 116/61  ?Pulse: 79  ?Weight: (!) 315 lb (142.9 kg)  ? ? ?Fetal Status: Fetal Heart Rate (bpm): 133   Movement: Present    ? ?General:  Alert, oriented and cooperative. Patient is in no acute distress.  ?Skin: Skin is warm and dry. No rash noted.   ?Cardiovascular: Normal heart rate noted  ?Respiratory: Normal respiratory effort, no problems with respiration noted  ?Abdomen: Soft, gravid, appropriate for gestational age.  Pain/Pressure: Absent     ?Pelvic: Cervical exam deferred        ?Extremities: Normal range of motion.  Edema: None  ?Mental Status: Normal mood and affect. Normal behavior. Normal judgment and thought content.  ? ?Assessment and Plan:  ?Pregnancy: [id number] at [redacted]w[redacted]d ?1. Chronic hypertension affecting pregnancy ?2. [redacted] weeks gestation of pregnancy ?3. Supervision of high risk pregnancy, antepartum ?Stable BP.  Will need weekly antenatal testing starting at 34 weeks due to morbid obesity and CHTN, this will be done here. Will continue growth scans at [facility] due to cost, patient preference. ?- Tdap vaccine greater than or equal to 7yo IM ?- US OB  Follow Up; Future ?Preterm labor symptoms and general obstetric precautions including but not limited to vaginal bleeding, contractions, leaking of fluid and fetal movement were reviewed in detail with the patient. ?Please refer to After Visit Summary for other counseling recommendations.  ? ?Return in about 2 weeks (around 05/25/2021) for BPP, NST, OFFICE OB VISIT (MD only) all here at [facility]. ? ?No future appointments. ? ?Verita Schneiders, MD ? ?

## 2021-05-18 ENCOUNTER — Telehealth: Payer: Self-pay

## 2021-05-18 NOTE — Telephone Encounter (Addendum)
-----   Message from Kathee Delton, RN sent at 05/11/2021 11:52 AM EDT ----- ?Regarding: [facility] ultrasound ?Patient needs a follow up ultrasound scheduled with [facility] with next available. April schedule is currently not open. [facility] recommended calling after 3/29.  ? ? ?First available [facility] [place] scheduled for 05/31/21 at 1330. Pt needs to be notified. ?

## 2021-05-21 NOTE — Telephone Encounter (Signed)
Called pt with interpreter Raquel. Pt states [facility] Department called her with appt. Encouraged patient to bring $160 to appt to cover cost. Order faxed to [facility]. ?

## 2021-05-24 ENCOUNTER — Ambulatory Visit (INDEPENDENT_AMBULATORY_CARE_PROVIDER_SITE_OTHER): Payer: Self-pay

## 2021-05-24 ENCOUNTER — Ambulatory Visit: Payer: Self-pay | Admitting: *Deleted

## 2021-05-24 ENCOUNTER — Ambulatory Visit (INDEPENDENT_AMBULATORY_CARE_PROVIDER_SITE_OTHER): Payer: Self-pay | Admitting: Family Medicine

## 2021-05-24 DIAGNOSIS — O10919 Unspecified pre-existing hypertension complicating pregnancy, unspecified trimester: Secondary | ICD-10-CM

## 2021-05-24 DIAGNOSIS — O0990 Supervision of high risk pregnancy, unspecified, unspecified trimester: Secondary | ICD-10-CM

## 2021-05-24 DIAGNOSIS — O99210 Obesity complicating pregnancy, unspecified trimester: Secondary | ICD-10-CM

## 2021-05-24 NOTE — Progress Notes (Signed)
Pt has [place] @ [facility] on 4/6.  ?

## 2021-05-24 NOTE — Progress Notes (Signed)
? ? ?  PRENATAL VISIT NOTE ? ?Subjective:  ?Sabrina Riley is a 26 y.o. G3P2002 at [redacted]w[redacted]d being seen today for ongoing prenatal care.  She is currently monitored for the following issues for this high-risk pregnancy and has Supervision of high risk pregnancy, antepartum; Maternal morbid obesity, antepartum ([place]); and Chronic hypertension affecting pregnancy on their problem list. ? ?Patient reports no complaints.  Contractions: Irregular. Vag. Bleeding: None.  Movement: Present. Denies leaking of fluid.  ? ?The following portions of the patient's history were reviewed and updated as appropriate: allergies, current medications, past family history, past medical history, past social history, past surgical history and problem list.  ? ?Objective:  ? ?Vitals:  ? 05/24/21 0903  ?BP: 136/73  ?Pulse: 84  ?Weight: (!) 319 lb 9.6 oz (145 kg)  ? ? ?Fetal Status: Fetal Heart Rate (bpm): RNST   Movement: Present    ? ?General:  Alert, oriented and cooperative. Patient is in no acute distress.  ?Skin: Skin is warm and dry. No rash noted.   ?Cardiovascular: Normal heart rate noted  ?Respiratory: Normal respiratory effort, no problems with respiration noted  ?Abdomen: Soft, gravid, appropriate for gestational age.  Pain/Pressure: Absent     ?Pelvic: Cervical exam deferred        ?Extremities: Normal range of motion.  Edema: None  ?Mental Status: Normal mood and affect. Normal behavior. Normal judgment and thought content.  ? ?Assessment and Plan:  ?Pregnancy: [id number] at [redacted]w[redacted]d ?1. Maternal morbid obesity, antepartum ([place]) ?In antenatal testing ?U/s for growth with [facility] with BPP next week. ?NST:  Baseline: 135 bpm, Variability: Good {> 6 bpm), Accelerations: Reactive, and Decelerations: Absent ? ? ?2. Chronic hypertension affecting pregnancy ?BP is well controlled on no meds ?Continue ASA ? ?3. Supervision of high risk pregnancy, antepartum ?Cultures next visit. ? ?Preterm labor symptoms and general obstetric precautions  including but not limited to vaginal bleeding, contractions, leaking of fluid and fetal movement were reviewed in detail with the patient. ?Please refer to After Visit Summary for other counseling recommendations.  ? ?Return in 2 weeks (on 06/07/2021) for [facility]  - schedule 4/13 & 4/20 HOB. ? ?Future Appointments  ?Date Time Provider [place]  ?05/24/2021  9:55 AM [facility] US1 [facility]  ?06/06/2021  1:55 PM Radene Gunning, MD [facility]  ?06/07/2021  9:15 AM [facility] NST [facility]  ?06/14/2021 10:15 AM [facility] NST [facility]  ? ? ?Donnamae Jude, MD ? ?

## 2021-06-04 NOTE — Progress Notes (Signed)
? ?  PRENATAL VISIT NOTE ? ?Subjective:  ?Sabrina Riley is a 26 y.o. G3P2002 at [redacted]w[redacted]d being seen today for ongoing prenatal care.  She is currently monitored for the following issues for this high-risk pregnancy and has Supervision of high risk pregnancy, antepartum; Maternal morbid obesity, antepartum ([place]); and Chronic hypertension affecting pregnancy on their problem list. ? ?Patient reports  occasional ctxns and vaginal pressure .  Contractions: Irritability (Simultaneous filing. User may not have seen previous data.). Vag. Bleeding: None (Simultaneous filing. User may not have seen previous data.).  Movement: Present (Simultaneous filing. User may not have seen previous data.). Denies leaking of fluid.  ? ?The following portions of the patient's history were reviewed and updated as appropriate: allergies, current medications, past family history, past medical history, past social history, past surgical history and problem list.  ? ?Objective:  ? ?Vitals:  ? 06/06/21 1419  ?BP: 130/74  ?Pulse: 97  ?Weight: (!) 318 lb (144.2 kg)  ? ? ?Fetal Status: Fetal Heart Rate (bpm): 132 Fundal Height: 38 cm Movement: Present (Simultaneous filing. User may not have seen previous data.)  Presentation:  (Simultaneous filing. User may not have seen previous data.) ? ?General:  Alert, oriented and cooperative. Patient is in no acute distress.  ?Skin: Skin is warm and dry. No rash noted.   ?Cardiovascular: Normal heart rate noted  ?Respiratory: Normal respiratory effort, no problems with respiration noted  ?Abdomen: Soft, gravid, appropriate for gestational age.  Pain/Pressure: Present (Simultaneous filing. User may not have seen previous data.)     ?Pelvic: Cervical exam performed in the presence of a chaperone Dilation: 1 Effacement (%): 60 Station: -2  ?Extremities: Normal range of motion.  Edema: None  ?Mental Status: Normal mood and affect. Normal behavior. Normal judgment and thought content.  ? ?Assessment and Plan:   ?Pregnancy: [id number] at [redacted]w[redacted]d ?1. Chronic hypertension affecting pregnancy ?No meds ?Had [place] at [employer] - working on obtaining the order.  ?Has NSTs starting tomorrow and then weekly for this and obesity.  ? ?2. Supervision of high risk pregnancy, antepartum ?GBS/GC/CT done today ? ?3. Maternal morbid obesity, antepartum ([place]) ? ? ?Preterm labor symptoms and general obstetric precautions including but not limited to vaginal bleeding, contractions, leaking of fluid and fetal movement were reviewed in detail with the patient. ?Please refer to After Visit Summary for other counseling recommendations.  ? ?Return in about 1 week (around 06/13/2021) for OB VISIT, MD or APP. ? ?Future Appointments  ?Date Time Provider [place]  ?06/07/2021  9:15 AM [facility] NST [facility]  ?06/14/2021 10:15 AM [facility] NST [facility]  ? ? ?Radene Gunning, MD ?

## 2021-06-06 ENCOUNTER — Encounter: Payer: Self-pay | Admitting: Obstetrics and Gynecology

## 2021-06-06 ENCOUNTER — Ambulatory Visit (INDEPENDENT_AMBULATORY_CARE_PROVIDER_SITE_OTHER): Payer: Self-pay | Admitting: Obstetrics and Gynecology

## 2021-06-06 ENCOUNTER — Other Ambulatory Visit (HOSPITAL_COMMUNITY)
Admission: RE | Admit: 2021-06-06 | Discharge: 2021-06-06 | Disposition: A | Discharge status: Home / Self Care | Payer: Self-pay | Source: Ambulatory Visit | Attending: Obstetrics and Gynecology | Admitting: Obstetrics and Gynecology

## 2021-06-06 VITALS — BP 130/74 | HR 97 | Wt 318.0 lb

## 2021-06-06 DIAGNOSIS — O0990 Supervision of high risk pregnancy, unspecified, unspecified trimester: Secondary | ICD-10-CM | POA: Insufficient documentation

## 2021-06-06 DIAGNOSIS — O99210 Obesity complicating pregnancy, unspecified trimester: Secondary | ICD-10-CM

## 2021-06-06 DIAGNOSIS — O10919 Unspecified pre-existing hypertension complicating pregnancy, unspecified trimester: Secondary | ICD-10-CM

## 2021-06-06 LAB — OB RESULTS CONSOLE GC/CHLAMYDIA: Gonorrhea: NEGATIVE

## 2021-06-07 ENCOUNTER — Ambulatory Visit: Payer: Self-pay | Admitting: *Deleted

## 2021-06-07 ENCOUNTER — Ambulatory Visit (INDEPENDENT_AMBULATORY_CARE_PROVIDER_SITE_OTHER): Payer: Self-pay

## 2021-06-07 DIAGNOSIS — O99210 Obesity complicating pregnancy, unspecified trimester: Secondary | ICD-10-CM

## 2021-06-07 DIAGNOSIS — O10919 Unspecified pre-existing hypertension complicating pregnancy, unspecified trimester: Secondary | ICD-10-CM

## 2021-06-07 LAB — CERVICOVAGINAL ANCILLARY ONLY
Chlamydia: NEGATIVE
Comment: NEGATIVE
Comment: NORMAL
Neisseria Gonorrhea: NEGATIVE

## 2021-06-08 ENCOUNTER — Encounter: Payer: Self-pay | Admitting: General Practice

## 2021-06-10 LAB — CULTURE, BETA STREP (GROUP B ONLY): Strep Gp B Culture: NEGATIVE

## 2021-06-11 NOTE — Progress Notes (Signed)
NST:  Baseline: 135 bpm, Variability: Good {> 6 bpm), Accelerations: Reactive, and Decelerations: Absent   

## 2021-06-14 ENCOUNTER — Ambulatory Visit: Payer: Self-pay | Admitting: *Deleted

## 2021-06-14 ENCOUNTER — Ambulatory Visit (INDEPENDENT_AMBULATORY_CARE_PROVIDER_SITE_OTHER): Payer: Self-pay

## 2021-06-14 VITALS — BP 128/76 | HR 72

## 2021-06-14 DIAGNOSIS — O99210 Obesity complicating pregnancy, unspecified trimester: Secondary | ICD-10-CM

## 2021-06-14 NOTE — Progress Notes (Signed)

## 2021-06-18 ENCOUNTER — Encounter: Payer: Self-pay | Admitting: Obstetrics and Gynecology

## 2021-06-21 ENCOUNTER — Ambulatory Visit: Payer: Self-pay | Admitting: *Deleted

## 2021-06-21 ENCOUNTER — Ambulatory Visit (INDEPENDENT_AMBULATORY_CARE_PROVIDER_SITE_OTHER): Payer: Self-pay

## 2021-06-21 ENCOUNTER — Ambulatory Visit (INDEPENDENT_AMBULATORY_CARE_PROVIDER_SITE_OTHER): Payer: Self-pay | Admitting: Family Medicine

## 2021-06-21 VITALS — BP 117/77 | HR 72 | Wt 324.2 lb

## 2021-06-21 DIAGNOSIS — O0990 Supervision of high risk pregnancy, unspecified, unspecified trimester: Secondary | ICD-10-CM

## 2021-06-21 DIAGNOSIS — O99210 Obesity complicating pregnancy, unspecified trimester: Secondary | ICD-10-CM

## 2021-06-21 DIAGNOSIS — O10919 Unspecified pre-existing hypertension complicating pregnancy, unspecified trimester: Secondary | ICD-10-CM

## 2021-06-21 NOTE — Progress Notes (Signed)
? ?  PRENATAL VISIT NOTE ? ?Subjective:  ?Sabrina Riley is a 26 y.o. G3P2002 at [redacted]w[redacted]d being seen today for ongoing prenatal care.  She is currently monitored for the following issues for this high-risk pregnancy and has Supervision of high risk pregnancy, antepartum; Maternal morbid obesity, antepartum ([place]); and Chronic hypertension affecting pregnancy on their problem list. ? ?Patient reports no complaints.  Contractions: Irregular. Vag. Bleeding: None.  Movement: Present. Denies leaking of fluid.  ? ?The following portions of the patient's history were reviewed and updated as appropriate: allergies, current medications, past family history, past medical history, past social history, past surgical history and problem list.  ? ?Objective:  ? ?Vitals:  ? 06/21/21 1403  ?BP: 117/77  ?Pulse: 72  ?Weight: (!) 324 lb 3.2 oz (147.1 kg)  ? ? ?Fetal Status: Fetal Heart Rate (bpm): RNST   Movement: Present    ? ?General:  Alert, oriented and cooperative. Patient is in no acute distress.  ?Skin: Skin is warm and dry. No rash noted.   ?Cardiovascular: Normal heart rate noted  ?Respiratory: Normal respiratory effort, no problems with respiration noted  ?Abdomen: Soft, gravid, appropriate for gestational age.  Pain/Pressure: Present     ?Pelvic: Cervical exam deferred        ?Extremities: Normal range of motion.     ?Mental Status: Normal mood and affect. Normal behavior. Normal judgment and thought content.  ?NST:  Baseline: 135 bpm, Variability: Good {> 6 bpm), Accelerations: Reactive, and Decelerations: Absent ? ?Assessment and Plan:  ?Pregnancy: [id number] at [redacted]w[redacted]d ?1. Supervision of high risk pregnancy, antepartum ?Continue routine prenatal care. ? ? ?2. Obesity affecting pregnancy, antepartum ?Normal testing  ?For IOL @ 39 + weeks ? ?3. Chronic hypertension affecting pregnancy ?On no meds ?On ASA ? ? ?Preterm labor symptoms and general obstetric precautions including but not limited to vaginal bleeding, contractions,  leaking of fluid and fetal movement were reviewed in detail with the patient. ?Please refer to After Visit Summary for other counseling recommendations.  ? ?No follow-ups on file. ? ?No future appointments. ? ?Donnamae Jude, MD ? ?

## 2021-06-21 NOTE — Patient Instructions (Signed)
Tercer trimestre de embarazo ?Third Trimester of Pregnancy ? ?El tercer trimestre de embarazo va desde la semana 28 hasta la semana 40. Esto corresponde a los meses 7 a 9. El tercer trimestre es un per?odo en el que el beb? en gestaci?n (feto) crece r?pidamente. Hacia el final del noveno mes, el feto mide alrededor de 20?pulgadas (45?cm) de largo y pesa entre 6 y 10 libras (2.7 y 4.5?kg). ?Cambios en el cuerpo durante el tercer trimestre ?Durante el tercer trimestre, su cuerpo contin?a experimentando numerosos cambios. Los cambios var?an y generalmente vuelven a la normalidad despu?s del nacimiento del beb?. ?Cambios f?sicos ?Seguir? aumentando de peso. Es de esperar que aumente entre 25 y 35?libras (11 y 16?kg) hacia el final del embarazo si inicia el embarazo con un peso normal. Si tiene bajo peso, es de esperar que aumente entre 28 y 40 libras (13 y?18 kg), y si tiene sobrepeso, es de esperar que aumente entre 15 y 25 libras (7 y 11?kg). ?Podr?n aparecer las primeras estr?as en las caderas, el abdomen y las mamas. ?Las mamas seguir?n creciendo y pueden doler. Un l?quido amarillo (calostro) puede salir de sus pechos. Esta es la primera leche que usted produce para su beb?. ?Tal vez haya cambios en el cabello. Esto cambios pueden incluir su engrosamiento, crecimiento r?pido y cambios en la textura. A algunas personas tambi?n se les cae el cabello durante o despu?s del embarazo, o tienen el cabello seco o fino. ?El ombligo puede salir hacia afuera. ?Puede observar que se le hinchan las manos, el rostro o los tobillos. ?Cambios en la salud ?Es posible que tenga acidez estomacal. ?Puede sufrir estre?imiento. ?Puede desarrollar hemorroides. ?Puede desarrollar venas hinchadas y abultadas (venas varicosas) en las piernas. ?Puede presentar m?s dolor en la pelvis, la espalda o los muslos. Esto se debe al aumento de peso y al aumento de las hormonas que relajan las articulaciones. ?Puede presentar un aumento del hormigueo o  entumecimiento en las manos, brazos y piernas. La piel de su abdomen tambi?n puede sentirse entumecida. ?Puede sentir que le falta el aire debido a que se expande el ?tero. ?Otros cambios ?Puede tener necesidad de orinar con m?s frecuencia porque el feto baja hacia la pelvis y ejerce presi?n sobre la vejiga. ?Puede tener m?s problemas para dormir. Esto puede deberse al tama?o de su abdomen, una mayor necesidad de orinar y un aumento en el metabolismo de su cuerpo. ?Puede notar que el feto ?baja? o lo siente m?s bajo, en el abdomen (aligeramiento). ?Puede tener un aumento de la secreci?n vaginal. ?Puede notar que tiene dolor alrededor del hueso p?lvico a medida que el ?tero se distiende. ?Siga estas instrucciones en su casa: ?Medicamentos ?Siga las instrucciones del m?dico en relaci?n con el uso de medicamentos. Durante el embarazo, hay medicamentos que pueden tomarse y otros que no. No tome ning?n medicamento a menos que lo haya autorizado el m?dico. ?Tome vitaminas prenatales que contengan por lo menos 600?microgramos (mcg) de ?cido f?lico. ?Comida y bebida ?Lleve una dieta saludable que incluya frutas y verduras frescas, cereales integrales, buenas fuentes de prote?nas como carnes magras, huevos o tofu, y productos l?cteos descremados. ?Evite la carne cruda y el jugo, la leche y el queso sin pasteurizar. Estos portan g?rmenes que pueden provocar da?o tanto a usted como al beb?. ?Tome 4 o 5 comidas peque?as en lugar de 3 comidas abundantes al d?a. ?Es posible que tenga que tomar estas medidas para prevenir o tratar el estre?imiento: ?Beber suficiente l?quido como para mantener la   orina de color amarillo p?lido. ?Consumir alimentos ricos en fibra, como frijoles, cereales integrales, y frutas y verduras frescas. ?Limitar el consumo de alimentos ricos en grasa y az?cares procesados, como los alimentos fritos o dulces. ?Actividad ?Haga ejercicio solamente como se lo haya indicado el m?dico. La mayor?a de las personas  pueden continuar su actividad f?sica habitual durante el embarazo. Intente realizar como m?nimo 30?minutos de actividad f?sica por lo menos 5?d?as a la semana. Deje de hacer ejercicio si experimenta contracciones en el ?tero. ?Deje de hacer ejercicio si le aparecen dolor o c?licos en la parte baja del vientre o de la espalda. ?Evite levantar pesos excesivos. ?No haga ejercicio si hace mucho calor o humedad, o si se encuentra a una altitud elevada. ?Si lo desea, puede seguir teniendo relaciones sexuales, salvo que el m?dico le indique lo contrario. ?Alivio del dolor y del malestar ?Haga pausas frecuentes y descanse con las piernas levantadas (elevadas) si tiene calambres en las piernas o dolor en la parte baja de la espalda. ?Dese ba?os de asiento con agua tibia para aliviar el dolor o las molestias causadas por las hemorroides. Use una crema para las hemorroides si el m?dico la autoriza. ?Use un sujetador que le brinde buen soporte para prevenir las molestias causadas por la sensibilidad en las mamas. ?Si tiene venas varicosas: ?Use medias de compresi?n como se lo haya indicado el m?dico. ?Eleve los pies durante 15?minutos, 3 o 4?veces por d?a. ?Limite el consumo de sal en su dieta. ?Seguridad ?Hable con su m?dico antes de viajar distancias largas. ?No se d? ba?os de inmersi?n en agua caliente, ba?os turcos ni saunas. ?Use el cintur?n de seguridad en todo momento mientras conduce o va en auto. ?Hable con el m?dico si es v?ctima de maltrato verbal o f?sico. ?Preparaci?n para el nacimiento ?Para prepararse para la llegada de su beb?: ?Tome clases prenatales para entender, practicar, y hacer preguntas sobre el trabajo de parto y el parto. ?Visite el hospital y recorra el ?rea de maternidad. ?Compre un asiento de seguridad orientado hacia atr?s, y aseg?rese de saber c?mo instalarlo en su autom?vil. ?Prepare la habitaci?n o el lugar donde dormir? el beb?. Aseg?rese de quitar todas las almohadas y animales de peluche de  la cuna del beb? para evitar la asfixia. ?Indicaciones generales ?Evite el contacto con las bandejas sanitarias de los gatos y la tierra que estos animales usan. Estos alimentos contienen bacterias que pueden causar defectos cong?nitos en el beb?. Si tiene un gato, p?dale a alguien que limpie la caja de arena por usted. ?No se haga lavados vaginales ni use tampones. No use toallas higi?nicas perfumadas. ?No consuma ning?n producto que contenga nicotina o tabaco, como cigarrillos, cigarrillos electr?nicos y tabaco de mascar. Si necesita ayuda para dejar de consumir estos productos, consulte al m?dico. ?No use ning?n remedio a base de hierbas, drogas ilegales o medicamentos que no le hayan sido recetados. Las sustancias qu?micas de estos productos pueden da?ar al beb?. ?No beba alcohol. ?Le realizar?n ex?menes prenatales m?s frecuentes durante el tercer trimestre. Durante una visita prenatal de rutina, el m?dico le har? un examen f?sico, le realizar? pruebas y hablar? con usted de su salud general. Cumpla con todas las visitas de seguimiento. Esto es importante. ?D?nde buscar m?s informaci?n ?American Pregnancy Association (Asociaci?n Estadounidense del Embarazo): americanpregnancy.org ?American College of Obstetricians and Gynecologists (Colegio Estadounidense de Obstetras y Ginec?logos): acog.org/womens-health/pregnancy? ?Office on Women's Health (Oficina para la Salud de la Mujer): womenshealth.gov/pregnancy ?Comun?quese con un m?dico si tiene: ?Fiebre. ?C?licos leves en   la pelvis, presi?n en la pelvis o dolor persistente en la zona abdominal o la parte baja de la espalda. ?V?mitos o diarrea. ?Secreci?n vaginal con mal olor u orina con mal olor. ?Dolor al orinar. ?Un dolor de cabeza que no desaparece despu?s de tomar analg?sicos. ?Cambios en la visi?n o ve manchas delante de los ojos. ?Solicite ayuda de inmediato si: ?Rompe la bolsa. ?Tiene contracciones regulares separadas por menos de 5?minutos. ?Tiene sangrado o  peque?as p?rdidas vaginales. ?Siente un dolor abdominal intenso. ?Tiene dificultad para respirar. ?Siente dolor en el pecho. ?Sufre episodios de desmayo. ?No ha sentido a su beb? moverse durante el pe

## 2021-06-22 ENCOUNTER — Other Ambulatory Visit (HOSPITAL_COMMUNITY): Payer: Self-pay | Admitting: *Deleted

## 2021-06-22 ENCOUNTER — Telehealth (HOSPITAL_COMMUNITY): Payer: Self-pay | Admitting: *Deleted

## 2021-06-22 ENCOUNTER — Encounter (HOSPITAL_COMMUNITY): Payer: Self-pay | Admitting: *Deleted

## 2021-06-22 NOTE — Telephone Encounter (Signed)
[id number] interpreter number  ?

## 2021-06-23 ENCOUNTER — Encounter (HOSPITAL_COMMUNITY): Payer: Self-pay | Admitting: Family Medicine

## 2021-06-23 ENCOUNTER — Inpatient Hospital Stay (HOSPITAL_COMMUNITY): Payer: Medicaid Other | Admitting: Anesthesiology

## 2021-06-23 ENCOUNTER — Inpatient Hospital Stay (HOSPITAL_COMMUNITY)
Admission: AD | Admit: 2021-06-23 | Discharge: 2021-06-25 | DRG: 807 | Disposition: A | Discharge status: Home / Self Care | Payer: Medicaid Other | Attending: Family Medicine | Admitting: Family Medicine

## 2021-06-23 ENCOUNTER — Inpatient Hospital Stay (HOSPITAL_COMMUNITY): Payer: Medicaid Other

## 2021-06-23 DIAGNOSIS — Z7982 Long term (current) use of aspirin: Secondary | ICD-10-CM

## 2021-06-23 DIAGNOSIS — O0990 Supervision of high risk pregnancy, unspecified, unspecified trimester: Secondary | ICD-10-CM

## 2021-06-23 DIAGNOSIS — O326XX1 Maternal care for compound presentation, fetus 1: Secondary | ICD-10-CM

## 2021-06-23 DIAGNOSIS — O99214 Obesity complicating childbirth: Secondary | ICD-10-CM | POA: Diagnosis present

## 2021-06-23 DIAGNOSIS — Z3A39 39 weeks gestation of pregnancy: Secondary | ICD-10-CM

## 2021-06-23 DIAGNOSIS — O10919 Unspecified pre-existing hypertension complicating pregnancy, unspecified trimester: Secondary | ICD-10-CM | POA: Diagnosis present

## 2021-06-23 DIAGNOSIS — O1002 Pre-existing essential hypertension complicating childbirth: Principal | ICD-10-CM | POA: Diagnosis present

## 2021-06-23 DIAGNOSIS — O1092 Unspecified pre-existing hypertension complicating childbirth: Secondary | ICD-10-CM | POA: Diagnosis not present

## 2021-06-23 DIAGNOSIS — O26893 Other specified pregnancy related conditions, third trimester: Secondary | ICD-10-CM | POA: Diagnosis present

## 2021-06-23 LAB — COMPREHENSIVE METABOLIC PANEL
ALT: 15 U/L (ref 0–44)
ALT: 14 U/L (ref 0–44)
AST: 18 U/L (ref 15–41)
AST: 17 U/L (ref 15–41)
Albumin: 2.7 g/dL — ABNORMAL LOW (ref 3.5–5.0)
Albumin: 2.7 g/dL — ABNORMAL LOW (ref 3.5–5.0)
Alkaline Phosphatase: 106 U/L (ref 38–126)
Alkaline Phosphatase: 103 U/L (ref 38–126)
Anion gap: 7 (ref 5–15)
Anion gap: 7 (ref 5–15)
BUN: 10 mg/dL (ref 6–20)
BUN: 7 mg/dL (ref 6–20)
CO2: 21 mmol/L — ABNORMAL LOW (ref 22–32)
CO2: 22 mmol/L (ref 22–32)
Calcium: 9.5 mg/dL (ref 8.9–10.3)
Calcium: 9 mg/dL (ref 8.9–10.3)
Chloride: 108 mmol/L (ref 98–111)
Chloride: 107 mmol/L (ref 98–111)
Creatinine, Ser: 0.86 mg/dL (ref 0.44–1.00)
Creatinine, Ser: 0.64 mg/dL (ref 0.44–1.00)
GFR, Estimated: >60 mL/min (ref >60)
GFR, Estimated: >60 mL/min (ref >60)
Glucose, Bld: 84 mg/dL (ref 70–99)
Glucose, Bld: 89 mg/dL (ref 70–99)
Potassium: 3.7 mmol/L (ref 3.5–5.1)
Potassium: 3.8 mmol/L (ref 3.5–5.1)
Sodium: 136 mmol/L (ref 135–145)
Sodium: 136 mmol/L (ref 135–145)
Total Bilirubin: 0.3 mg/dL (ref 0.3–1.2)
Total Bilirubin: 0.5 mg/dL (ref 0.3–1.2)
Total Protein: 6.6 g/dL (ref 6.5–8.1)
Total Protein: 6.5 g/dL (ref 6.5–8.1)

## 2021-06-23 LAB — CBC
HCT: 36.6 % (ref 36.0–46.0)
HCT: 37.4 % (ref 36.0–46.0)
HCT: 38.5 % (ref 36.0–46.0)
Hemoglobin: 12.4 g/dL (ref 12.0–15.0)
Hemoglobin: 13 g/dL (ref 12.0–15.0)
Hemoglobin: 13.1 g/dL (ref 12.0–15.0)
MCH: 28.6 pg (ref 26.0–34.0)
MCH: 28.8 pg (ref 26.0–34.0)
MCH: 29.2 pg (ref 26.0–34.0)
MCHC: 33.8 g/dL (ref 30.0–36.0)
MCHC: 33.9 g/dL (ref 30.0–36.0)
MCHC: 35 g/dL (ref 30.0–36.0)
MCV: 83.5 fL (ref 80.0–100.0)
MCV: 84.8 fL (ref 80.0–100.0)
MCV: 84.9 fL (ref 80.0–100.0)
Platelets: 188 10*3/uL (ref 150–400)
Platelets: 197 10*3/uL (ref 150–400)
Platelets: 202 10*3/uL (ref 150–400)
RBC: 4.31 MIL/uL (ref 3.87–5.11)
RBC: 4.48 MIL/uL (ref 3.87–5.11)
RBC: 4.54 MIL/uL (ref 3.87–5.11)
RDW: 14 % (ref 11.5–15.5)
RDW: 14.1 % (ref 11.5–15.5)
RDW: 14.2 % (ref 11.5–15.5)
WBC: 11.1 10*3/uL — ABNORMAL HIGH (ref 4.0–10.5)
WBC: 8.8 10*3/uL (ref 4.0–10.5)
WBC: 9.2 10*3/uL (ref 4.0–10.5)
nRBC: 0 % (ref 0.0–0.2)
nRBC: 0 % (ref 0.0–0.2)
nRBC: 0 % (ref 0.0–0.2)

## 2021-06-23 LAB — TYPE AND SCREEN
ABO/RH(D): O POS
Antibody Screen: NEGATIVE

## 2021-06-23 LAB — RPR: RPR Ser Ql: NONREACTIVE

## 2021-06-23 MED ORDER — LIDOCAINE HCL (PF) 1 % IJ SOLN: 30.0000 mL | INTRAMUSCULAR | Status: DC | PRN

## 2021-06-23 MED ORDER — LACTATED RINGERS AMNIOINFUSION: INTRAVENOUS | Status: DC

## 2021-06-23 MED ORDER — BENZOCAINE-MENTHOL 20-0.5 % EX AERO: 1.0000 "application " | INHALATION_SPRAY | CUTANEOUS | Status: DC | PRN

## 2021-06-23 MED ORDER — LACTATED RINGERS IV SOLN: 500.0000 mL | Freq: Once | INTRAVENOUS | Status: DC

## 2021-06-23 MED ORDER — MISOPROSTOL 50MCG HALF TABLET
50.0000 ug | ORAL_TABLET | ORAL | Status: DC | PRN
  Administered 2021-06-23 (×3): 50 ug via ORAL
  Filled 2021-06-23 (×3): qty 1

## 2021-06-23 MED ORDER — LACTATED RINGERS IV SOLN: 500.0000 mL | INTRAVENOUS | Status: DC | PRN

## 2021-06-23 MED ORDER — PRENATAL MULTIVITAMIN CH
1.0000 | ORAL_TABLET | Freq: Every day | ORAL | Status: DC
  Administered 2021-06-24: 1 via ORAL
  Filled 2021-06-23: qty 1

## 2021-06-23 MED ORDER — FENTANYL-BUPIVACAINE-NACL 0.5-0.125-0.9 MG/250ML-% EP SOLN
EPIDURAL | Status: DC | PRN
  Administered 2021-06-23: 12 mL/h via EPIDURAL

## 2021-06-23 MED ORDER — ZOLPIDEM TARTRATE 5 MG PO TABS: 5.0000 mg | ORAL_TABLET | Freq: Every evening | ORAL | Status: DC | PRN

## 2021-06-23 MED ORDER — ONDANSETRON HCL 4 MG/2ML IJ SOLN
4.0000 mg | INTRAMUSCULAR | Status: DC | PRN
Start: 2021-06-23 — End: 2021-06-25

## 2021-06-23 MED ORDER — COCONUT OIL OIL: 1.0000 "application " | TOPICAL_OIL | Status: DC | PRN

## 2021-06-23 MED ORDER — TERBUTALINE SULFATE 1 MG/ML IJ SOLN
0.2500 mg | Freq: Once | INTRAMUSCULAR | Status: AC | PRN
  Administered 2021-06-23: 0.25 mg via SUBCUTANEOUS
  Filled 2021-06-23: qty 1

## 2021-06-23 MED ORDER — OXYTOCIN-SODIUM CHLORIDE 30-0.9 UT/500ML-% IV SOLN
2.5000 [IU]/h | INTRAVENOUS | Status: DC
Start: 2021-06-23 — End: 2021-06-23
  Administered 2021-06-23: 2.5 [IU]/h via INTRAVENOUS
  Filled 2021-06-23: qty 500

## 2021-06-23 MED ORDER — DIPHENHYDRAMINE HCL 25 MG PO CAPS: 25.0000 mg | ORAL_CAPSULE | Freq: Four times a day (QID) | ORAL | Status: DC | PRN

## 2021-06-23 MED ORDER — OXYCODONE-ACETAMINOPHEN 5-325 MG PO TABS: 2.0000 | ORAL_TABLET | ORAL | Status: DC | PRN

## 2021-06-23 MED ORDER — OXYCODONE-ACETAMINOPHEN 5-325 MG PO TABS: 1.0000 | ORAL_TABLET | ORAL | Status: DC | PRN

## 2021-06-23 MED ORDER — LACTATED RINGERS IV SOLN: INTRAVENOUS | Status: DC

## 2021-06-23 MED ORDER — ACETAMINOPHEN 325 MG PO TABS: 650.0000 mg | ORAL_TABLET | ORAL | Status: DC | PRN

## 2021-06-23 MED ORDER — ONDANSETRON HCL 4 MG PO TABS: 4.0000 mg | ORAL_TABLET | ORAL | Status: DC | PRN

## 2021-06-23 MED ORDER — PHENYLEPHRINE 80 MCG/ML (10ML) SYRINGE FOR IV PUSH (FOR BLOOD PRESSURE SUPPORT): 80.0000 ug | PREFILLED_SYRINGE | INTRAVENOUS | Status: DC | PRN

## 2021-06-23 MED ORDER — EPHEDRINE 5 MG/ML INJ: 10.0000 mg | INTRAVENOUS | Status: DC | PRN

## 2021-06-23 MED ORDER — ONDANSETRON HCL 4 MG/2ML IJ SOLN: 4.0000 mg | Freq: Four times a day (QID) | INTRAMUSCULAR | Status: DC | PRN

## 2021-06-23 MED ORDER — WITCH HAZEL-GLYCERIN EX PADS: 1.0000 "application " | MEDICATED_PAD | CUTANEOUS | Status: DC | PRN

## 2021-06-23 MED ORDER — LIDOCAINE HCL (PF) 1 % IJ SOLN
INTRAMUSCULAR | Status: DC | PRN
  Administered 2021-06-23: 2 mL via EPIDURAL
  Administered 2021-06-23: 10 mL via EPIDURAL

## 2021-06-23 MED ORDER — DIBUCAINE (PERIANAL) 1 % EX OINT: 1.0000 "application " | TOPICAL_OINTMENT | CUTANEOUS | Status: DC | PRN

## 2021-06-23 MED ORDER — SIMETHICONE 80 MG PO CHEW: 80.0000 mg | CHEWABLE_TABLET | ORAL | Status: DC | PRN

## 2021-06-23 MED ORDER — SENNOSIDES-DOCUSATE SODIUM 8.6-50 MG PO TABS
2.0000 | ORAL_TABLET | ORAL | Status: DC
  Administered 2021-06-25: 2 via ORAL
  Filled 2021-06-23: qty 2

## 2021-06-23 MED ORDER — OXYTOCIN-SODIUM CHLORIDE 30-0.9 UT/500ML-% IV SOLN
1.0000 m[IU]/min | INTRAVENOUS | Status: DC
  Administered 2021-06-23: 2 m[IU]/min via INTRAVENOUS

## 2021-06-23 MED ORDER — SOD CITRATE-CITRIC ACID 500-334 MG/5ML PO SOLN: 30.0000 mL | ORAL | Status: DC | PRN

## 2021-06-23 MED ORDER — MISOPROSTOL 25 MCG QUARTER TABLET: 25.0000 ug | ORAL_TABLET | ORAL | Status: DC | PRN

## 2021-06-23 MED ORDER — TERBUTALINE SULFATE 1 MG/ML IJ SOLN: 0.2500 mg | Freq: Once | INTRAMUSCULAR | Status: DC | PRN

## 2021-06-23 MED ORDER — TETANUS-DIPHTH-ACELL PERTUSSIS 5-2.5-18.5 LF-MCG/0.5 IM SUSY: 0.5000 mL | PREFILLED_SYRINGE | Freq: Once | INTRAMUSCULAR | Status: DC

## 2021-06-23 MED ORDER — FLEET ENEMA 7-19 GM/118ML RE ENEM: 1.0000 | ENEMA | RECTAL | Status: DC | PRN

## 2021-06-23 MED ORDER — OXYTOCIN BOLUS FROM INFUSION
333.0000 mL | Freq: Once | INTRAVENOUS | Status: AC
  Administered 2021-06-23: 333 mL via INTRAVENOUS

## 2021-06-23 MED ORDER — FENTANYL-BUPIVACAINE-NACL 0.5-0.125-0.9 MG/250ML-% EP SOLN
12.0000 mL/h | EPIDURAL | Status: DC | PRN
  Filled 2021-06-23: qty 250

## 2021-06-23 MED ORDER — DIPHENHYDRAMINE HCL 50 MG/ML IJ SOLN: 12.5000 mg | INTRAMUSCULAR | Status: DC | PRN

## 2021-06-23 MED ORDER — IBUPROFEN 600 MG PO TABS
600.0000 mg | ORAL_TABLET | Freq: Four times a day (QID) | ORAL | Status: DC
  Administered 2021-06-24 – 2021-06-25 (×6): 600 mg via ORAL
  Filled 2021-06-23 (×6): qty 1

## 2021-06-23 NOTE — Progress Notes (Signed)
Assessed patient at bedside for deceleration ? ?Appears to be in prolonged decel in the 90s with consistent contractions. ? ?Cervix checked about 7 cm and thinned out to 80%.  Had -2 station which is also quite distant from prior exam. ? ?IUPC and FSE placed for better monitoring. ? ?Patient continues to have decelerations and therefore Pitocin turned off and terbutaline given.  ? ?While patient on her side fetal heart rate improved to the 110s120s.  She then had another contraction which led to a deep variable deceleration to the 70s. ? ?Amnioinfusion started. ? ?At that time fetal recovery with back to baseline in the 120s with moderate variability. ? ?Discussed with the patient that this is hyperstimulation (frequent contractions) as well as quick cervical change.  We will monitor for at least 30 to 45 minutes and consider restarting Pitocin if needed.  ? ?Renard Matter, MD, MPH ?OB Fellow, Faculty Practice ? ?

## 2021-06-23 NOTE — H&P (Signed)
OBSTETRIC ADMISSION HISTORY AND PHYSICAL ? ?Sabrina Riley is a 26 y.o. female G3P2002 with IUP at [redacted]w[redacted]d by 15 week [place] presenting for SROM/SOL. She reports +FMs, no VB, no blurry vision, headaches or peripheral edema, and RUQ pain.  She plans on breast feeding. She request hormonal IUD at her postpartum visit for birth control. ?She received her [facility]  ? ?Two previous uncomplicated vaginal deliveries.  ? ?Dating: By 15 week [place] --->  Estimated Date of Delivery: 06/30/21 ? ?Prenatal History/Complications:  ?--BMI 57  ?--Chronic hypertension, no medications  ? ?Past Medical History: ?Past Medical History:  ?Diagnosis Date  ? Asthma   ? History of gestational hypertension 10/14/2014  ? Hypertension   ? Labor and delivery indication for care or intervention 05/18/2018  ? Morbid obesity (HCC)   ? Morbid obesity (HCC) 05/18/2018  ? ? ?Past Surgical History: ?Past Surgical History:  ?Procedure Laterality Date  ? NO PAST SURGERIES    ? ? ?Obstetrical History: ?OB History   ? ? Gravida  ?3  ? Para  ?2  ? Term  ?2  ? Preterm  ?   ? AB  ?   ? Living  ?2  ?  ? ? SAB  ?   ? IAB  ?   ? Ectopic  ?   ? Multiple  ?0  ? Live Births  ?2  ?   ?  ?  ? ? ?Social History ?Social History  ? ?Socioeconomic History  ? Marital status: Single  ?  Spouse name: Not on file  ? Number of children: 2  ? Years of education: Not on file  ? Highest education level: High school graduate  ?Occupational History  ? Occupation: unemployed  ?Tobacco Use  ? Smoking status: Never  ?  Passive exposure: Never  ? Smokeless tobacco: Never  ?Vaping Use  ? Vaping Use: Never used  ?Substance and Sexual Activity  ? Alcohol use: Never  ? Drug use: Never  ? Sexual activity: Yes  ?Other Topics Concern  ? Not on file  ?Social History Narrative  ? Not on file  ? ?Social Determinants of Health  ? ?Financial Resource Strain: Low Risk   ? Difficulty of Paying Living Expenses: Not hard at all  ?Food Insecurity: Food Insecurity Present  ? Worried About Community education officer in the Last Year: Sometimes true  ? Ran Out of Food in the Last Year: Sometimes true  ?Transportation Needs: No Transportation Needs  ? Lack of Transportation (Medical): No  ? Lack of Transportation (Non-Medical): No  ?Physical Activity: Sufficiently Active  ? Days of Exercise per Week: 7 days  ? Minutes of Exercise per Session: 30 min  ?Stress: No Stress Concern Present  ? Feeling of Stress : Not at all  ?Social Connections: Moderately Integrated  ? Frequency of Communication with Friends and Family: More than three times a week  ? Frequency of Social Gatherings with Friends and Family: More than three times a week  ? Attends Religious Services: More than 4 times per year  ? Active Member of Clubs or Organizations: No  ? Attends Banker Meetings: Never  ? Marital Status: Living with partner  ? ? ?Family History: ?Family History  ?Problem Relation Age of Onset  ? Heart disease Father   ? Diabetes Father   ? Thyroid disease Sister   ? ? ?Allergies: ?No Known Allergies ? ?Medications Prior to Admission  ?Medication Sig Dispense Refill Last Dose  ?  aspirin EC 81 MG tablet Take 1 tablet (81 mg total) by mouth daily. Swallow whole. 30 tablet 11   ? prenatal vitamin w/FE, FA (PRENATAL 1 + 1) 27-1 MG TABS tablet Take 1 tablet by mouth daily at 12 noon. 30 tablet 0   ? ? ? ?Review of Systems  ? ?All systems reviewed and negative except as stated in HPI ? ?Height 5\' 3"  (1.6 m), weight (!) 147.1 kg, last menstrual period 08/26/2020, unknown if currently breastfeeding. ?General appearance: alert, cooperative, and no distress ?Lungs: Normal WOB  ?Heart: regular rate  ?Abdomen: soft, non-tender ?Pelvic: NEFG ?Extremities: Homans sign is negative, no sign of DVT ?Presentation: cephalic ?Fetal monitoring Baseline: 130 bpm, Variability: Good {> 6 bpm), Accelerations: Reactive, and Decelerations: Absent ?Uterine activity irregular  ?Exam by:: [email or web address] ? ? ?Prenatal labs: ?ABO, Rh: --/--/PENDING (04/29  0015) ?Antibody: PENDING (04/29 0015) ?Rubella: 1.76 (10/10 1008) ?RPR: Non Reactive (02/28 0859)  ?HBsAg: Negative (10/10 1008)  ?HIV: Non Reactive (02/28 0859)  ?GBS: Negative/-- (04/12 1610)  ?2 hr Glucola passed ?Genetic screening LR female  ?Anatomy 06-02-1975 normal  ? ?Prenatal Transfer Tool  ?Maternal Diabetes: No ?Genetic Screening: Normal ?Maternal Ultrasounds/Referrals: Normal ?Fetal Ultrasounds or other Referrals:  None ?Maternal Substance Abuse:  No ?Significant Maternal Medications:  None ?Significant Maternal Lab Results: Group B Strep negative ? ?Results for orders placed or performed during the hospital encounter of 06/23/21 (from the past 24 hour(s))  ?Type and screen  ? Collection Time: 06/23/21 12:15 AM  ?Result Value Ref Range  ? ABO/RH(D) PENDING   ? Antibody Screen PENDING   ? Sample Expiration    ?  06/26/2021,2359 ?Performed at [facility], [address] [place], [place] ?  ?CBC  ? Collection Time: 06/23/21 12:23 AM  ?Result Value Ref Range  ? WBC 8.8 4.0 - 10.5 K/uL  ? RBC 4.48 3.87 - 5.11 MIL/uL  ? Hemoglobin 13.1 12.0 - 15.0 g/dL  ? HCT 37.4 36.0 - 46.0 %  ? MCV 83.5 80.0 - 100.0 fL  ? MCH 29.2 26.0 - 34.0 pg  ? MCHC 35.0 30.0 - 36.0 g/dL  ? RDW 14.1 11.5 - 15.5 %  ? Platelets 202 150 - 400 K/uL  ? nRBC 0.0 0.0 - 0.2 %  ? ? ?Patient Active Problem List  ? Diagnosis Date Noted  ? Chronic hypertension in pregnancy 06/23/2021  ? Chronic hypertension affecting pregnancy 12/13/2020  ? Supervision of high risk pregnancy, antepartum 12/04/2020  ? Maternal morbid obesity, antepartum (HCC) 12/04/2020  ? ? ?Assessment/Plan:  ?Sabrina Riley is a 26 y.o. G3P2002 at [redacted]w[redacted]d here for IOL due to chronic hypertension.  ? ?#Labor: Plan to start with cytotec and reassess in 4 hours. Can consider FB on next check if needed and patient amenable.  ?#Pain: PRN  ?#FWB: Cat I  ?#ID: GBS negative  ?#MOF: Breastfeeding  ?#MOC: planning for Mirena/hormonal IUD at [facility] ?#Circ: No  ? ?[facility],  DO  ?06/23/2021, 1:13 AM ? ?  ?

## 2021-06-23 NOTE — Anesthesia Preprocedure Evaluation (Signed)
Anesthesia Evaluation  ?Patient identified by MRN, date of birth, ID band ?Patient awake ? ? ? ?Reviewed: ?Allergy & Precautions, Patient's Chart, lab work & pertinent test results ? ?Airway ?Mallampati: III ? ?TM Distance: >3 FB ?Neck ROM: Full ? ? ? Dental ?no notable dental hx. ? ?  ?Pulmonary ?asthma ,  ?  ?Pulmonary exam normal ?breath sounds clear to auscultation ? ? ? ? ? ? Cardiovascular ?hypertension (PIH), Normal cardiovascular exam ?Rhythm:Regular Rate:Normal ? ? ?  ?Neuro/Psych ?negative neurological ROS ? negative psych ROS  ? GI/Hepatic ?negative GI ROS, Neg liver ROS,   ?Endo/Other  ?Morbid obesityBMI 58 ? Renal/GU ?negative Renal ROS  ?negative genitourinary ?  ?Musculoskeletal ?negative musculoskeletal ROS ?(+)  ? Abdominal ?(+) + obese,   ?Peds ?negative pediatric ROS ?(+)  Hematology ?negative hematology ROS ?(+) Hb 13, plt 197   ?Anesthesia Other Findings ? ? Reproductive/Obstetrics ?(+) Pregnancy ? ?  ? ? ? ? ? ? ? ? ? ? ? ? ? ?  ?  ? ? ? ? ? ? ? ? ?Anesthesia Physical ?Anesthesia Plan ? ?ASA: 3 ? ?Anesthesia Plan: Epidural  ? ?Post-op Pain Management:   ? ?Induction:  ? ?PONV Risk Score and Plan: 2 ? ?Airway Management Planned: Natural Airway ? ?Additional Equipment: None ? ?Intra-op Plan:  ? ?Post-operative Plan:  ? ?Informed Consent: I have reviewed the patients History and Physical, chart, labs and discussed the procedure including the risks, benefits and alternatives for the proposed anesthesia with the patient or authorized representative who has indicated his/her understanding and acceptance.  ? ? ? ? ? ?Plan Discussed with:  ? ?Anesthesia Plan Comments:   ? ? ? ? ? ? ?Anesthesia Quick Evaluation ? ?

## 2021-06-23 NOTE — Progress Notes (Addendum)
Labor Progress Note ?[name] is a 26 y.o. G3P2002 at [redacted]w[redacted]d presented for IOL due to cHTN ?S: Patient is resting comfortably and endorses more frequent contraction. ? ?O:  ?BP (!) 116/58   Pulse 67   Temp 97.8 ?F (36.6 ?C) (Oral)   Resp 17   Ht 5\' 3"  (1.6 m)   Wt (!) 147.1 kg   LMP 08/26/2020 (Approximate)   SpO2 97%   BMI 57.46 kg/m?  ?EFM: 140 bpm /moderate variability/+accels ? ?CVE: Dilation: 3 ?Effacement (%): 60 ?Station: -2, -3 ?Presentation: Vertex ?Exam by: 10/27/2020, MD ? ? ?A&P: 26 y.o. 30 [redacted]w[redacted]d  ?#Labor: Progressing well. Dilated to 3cm after Cytotec. Was checking to place balloon however patient very uncomfortable and already 3 cm and therefore won't do balloon. Encouraged ambulation with plan to reassess in 3-4 hours. Will assess for AROM at that time.  ?#Pain: PRN,  ?#FWB: Cat 1 ?#GBS negative ? ?[redacted]w[redacted]d, MD ?Center for [facility], [facility] ?12:44 PM ? ?GME ATTESTATION:  ?I saw and evaluated the patient. I agree with the findings and the plan of care as documented in the resident?s note and made all necessary edits. ? ?[facility], MD, MPH ?OB Fellow, Faculty PracticE ?[place], [facility] for [facility] ?06/23/2021 4:22 PM ? ?

## 2021-06-23 NOTE — Lactation Note (Signed)
This note was copied from a baby's chart. ?Lactation Consultation Note ? ?Patient Name: Sabrina Riley ?Today's Date: 06/23/2021 ?Reason for consult: L&D Initial assessment;Term ?Age:27 hours ?In-house interpreter used Raquel. ?P3, term, female infant. ? ?LC entered room and mom had infant latched on her left breast using the cradle hold.  ? ?Infant was latched with depth, swallows observed and infant was still breastfeeding after 7 min when LC left the room.  ? ?LC reviewed feeding cues, on demand, 8-12+ times per day, and STS.  ? ?If mom has questions or concerns, call RN/LC on MBU. ? ?LC congratulated parents on the birth of their son.  ? ? ?Maternal Data ?Has patient been taught Hand Expression?: Yes ?Does the patient have breastfeeding experience prior to this delivery?: Yes ?How long did the patient breastfeed?: Per mom breastfed first child for 4 years and 2nd child for 1 year. ? ?Feeding ?Mother's Current Feeding Choice: Breast Milk and Formula ? ?LATCH Score ?Latch: Grasps breast easily, tongue down, lips flanged, rhythmical sucking. ? ?Audible Swallowing: Spontaneous and intermittent ? ?Type of Nipple: Everted at rest and after stimulation ? ?Comfort (Breast/Nipple): Soft / non-tender ? ?Hold (Positioning): No assistance needed to correctly position infant at breast. ? ?LATCH Score: 10 ? ? ?Lactation Tools Discussed/Used ?  ? ?Interventions ?  ? ?Discharge ?  ? ?Consult Status ?Consult Status: Follow-up from L&D ? ? ? ?Suzzette Gasparro P Easton, IBCLC ?06/23/2021, 10:05 PM ? ? ? ?

## 2021-06-23 NOTE — Progress Notes (Signed)
Checked in with patient's RN.  ? ?Patient hurting and wanting her epidural. Plan for epidural placement and then check and will plan for AROM at that time. ? ? ?Warner Mccreedy, MD, MPH ?OB Fellow, Faculty Practice ? ?

## 2021-06-23 NOTE — Progress Notes (Signed)
Delayed documentation ? ?Assessed patient at bedside. Feels well. Comfortable with epidural.  ? ?Ok with check and AROM ? ?Tracing: ?Baseline 140, moderate variability, accels present, no decels ? ? ?Cervix posterior, to maternal right, head well applied ? ?AROM performed and tolerated well by patient and fetus. Clear fluid. Will observe and can start pitocin as long as not contracting too much. ? ?Warner Mccreedy, MD, MPH ?OB Fellow, Faculty Practice ? ? ? ?

## 2021-06-23 NOTE — Progress Notes (Signed)
Labor Progress Note ?Chenee Munns is a 26 y.o. G3P2002 at [redacted]w[redacted]d presented for IOL due to chronic hypertension.  ? ?S: Doing well. Feeling some mild contractions with the cytotec.  ? ?O:  ?BP 118/62   Pulse 84   Temp 97.8 ?F (36.6 ?C) (Oral)   Resp 14   Ht 5\' 3"  (1.6 m)   Wt (!) 147.1 kg   LMP 08/26/2020 (Approximate)   SpO2 97%   BMI 57.46 kg/m?  ?EFM: 125/mod/15x15/none ? ?CVE: Dilation: 2 ?Effacement (%): 50 ?Station: -2 ?Presentation: Vertex ?Exam by:: Dr. [email or web address] ? ? ?A&P: 26 y.o. G3P2002 [redacted]w[redacted]d  ?#Labor: Some progression since last check. Will give one additional cytotec and then likely transition to pit on the next check.  ?#Pain: PRN  ?#FWB: Cat I  ?#GBS negative ? ?[redacted]w[redacted]d, DO ?6:41 AM  ?

## 2021-06-23 NOTE — Anesthesia Procedure Notes (Signed)
Epidural ?Patient location during procedure: OB ?Start time: 06/23/2021 4:36 PM ?End time: 06/23/2021 4:47 PM ? ?Staffing ?Anesthesiologist: Pervis Hocking, DO ?Performed: anesthesiologist  ? ?Preanesthetic Checklist ?Completed: patient identified, IV checked, risks and benefits discussed, monitors and equipment checked, pre-op evaluation and timeout performed ? ?Epidural ?Patient position: sitting ?Prep: DuraPrep and site prepped and draped ?Patient monitoring: continuous pulse ox, blood pressure, heart rate and cardiac monitor ?Approach: midline ?Location: L3-L4 ?Injection technique: LOR air ? ?Needle:  ?Needle type: Tuohy  ?Needle gauge: 17 G ?Needle length: 9 cm ?Needle insertion depth: 9 cm ?Catheter type: closed end flexible ?Catheter size: 19 Gauge ?Catheter at skin depth: 15 cm ?Test dose: negative ? ?Assessment ?Sensory level: T8 ?Events: blood not aspirated, injection not painful, no injection resistance, no paresthesia and negative IV test ? ?Additional Notes ?Patient identified. Risks/Benefits/Options discussed with patient including but not limited to bleeding, infection, nerve damage, paralysis, failed block, incomplete pain control, headache, blood pressure changes, nausea, vomiting, reactions to medication both or allergic, itching and postpartum back pain. Confirmed with bedside nurse the patient's most recent platelet count. Confirmed with patient that they are not currently taking any anticoagulation, have any bleeding history or any family history of bleeding disorders. Patient expressed understanding and wished to proceed. All questions were answered. Sterile technique was used throughout the entire procedure. Please see nursing notes for vital signs. Test dose was given through epidural catheter and negative prior to continuing to dose epidural or start infusion. Warning signs of high block given to the patient including shortness of breath, tingling/numbness in hands, complete motor  block, or any concerning symptoms with instructions to call for help. Patient was given instructions on fall risk and not to get out of bed. All questions and concerns addressed with instructions to call with any issues or inadequate analgesia.  Reason for block:procedure for pain ? ? ? ?

## 2021-06-23 NOTE — Discharge Summary (Signed)
? ?  Postpartum Discharge Summary ? ?   ?Patient Name: Sabrina Riley ?DOB: 10-09-1995 ?MRN: [id number] ? ?Date of admission: 06/23/2021 ?Delivery date:06/23/2021  ?Delivering provider: EMLY, JESSICA  ?Date of discharge: 06/25/2021 ? ?Admitting diagnosis: Chronic hypertension in pregnancy [O10.919] ?Intrauterine pregnancy: [redacted]w[redacted]d    ?Secondary diagnosis:  Principal Problem: ?  Vaginal delivery ?Active Problems: ?  Supervision of high risk pregnancy, antepartum ?  Maternal morbid obesity, antepartum ([place] ?  Chronic hypertension in pregnancy ? ?Additional problems: None     ?Discharge diagnosis: Term Pregnancy Delivered                                              ?Post partum procedures: None ?Augmentation: AROM, Pitocin, and Cytotec ?Complications: None ? ?Hospital course: Induction of Labor With Vaginal Delivery   ?26y.o. yo G3P2002 at 370w0das admitted to the hospital 06/23/2021 for induction of labor.  Indication for induction:  CHTN .  Patient had an uncomplicated labor course as follows: ?Membrane Rupture Time/Date: 5:33 PM ,06/23/2021   ?Delivery Method:Vaginal, Spontaneous  ?Episiotomy: None  ?Lacerations:  None  ?Details of delivery can be found in separate delivery note.  Patient had a routine postpartum course.  Her BP remained within normal range while inpatient.  She was started on Lasix, which she will continue to complete a 5 day course.  She is eating, drinking, voiding, and ambulating without issue.  Her pain and bleeding are controlled.  She is breastfeeding well.  Patient is discharged home 06/25/21. ? ?Newborn Data: ?Birth date:06/23/2021  ?Birth time:9:13 PM  ?Gender:Female -Juan ?Living status:Living  ?Apgars:9 ,9  ?Weight:3790 g  ? ?Magnesium Sulfate received: No ?BMZ received: No ?Rhophylac: N/A ?MMR: N/A ?T-DaP: Given prenatally ?Flu: Given prenatally  ?Transfusion: No ? ?Physical exam  ?Vitals:  ? 06/24/21 0900 06/24/21 1245 06/24/21 2155 06/25/21 0622  ?BP: 124/75 (!) 113/58 109/62 123/70   ?Pulse: 60 76 71 71  ?Resp: 18 16 18 18   ?Temp: 98.4 ?F (36.9 ?C) 97.9 ?F (36.6 ?C) 98 ?F (36.7 ?C) 98.3 ?F (36.8 ?C)  ?TempSrc: Oral Oral Oral Oral  ?SpO2: 99% 99% 98% 99%  ?Weight:      ?Height:      ? ?General: alert, cooperative, and no distress ?Lochia: appropriate ?Uterine Fundus: firm and below umbilicus  ?DVT Evaluation: trace LE edema at ankles bilaterally, no calf tenderness to palpation  ? ?Labs: ?Lab Results  ?Component Value Date  ? WBC 11.5 (H) 06/24/2021  ? HGB 11.7 (L) 06/24/2021  ? HCT 34.8 (L) 06/24/2021  ? MCV 85.7 06/24/2021  ? PLT 183 06/24/2021  ? ? ?  Latest Ref Rng & Units 06/23/2021  ?  2:51 PM  ?CMP  ?Glucose 70 - 99 mg/dL 89    ?BUN 6 - 20 mg/dL 7    ?Creatinine 0.44 - 1.00 mg/dL 0.64    ?Sodium 135 - 145 mmol/L 136    ?Potassium 3.5 - 5.1 mmol/L 3.8    ?Chloride 98 - 111 mmol/L 107    ?CO2 22 - 32 mmol/L 22    ?Calcium 8.9 - 10.3 mg/dL 9.0    ?Total Protein 6.5 - 8.1 g/dL 6.5    ?Total Bilirubin 0.3 - 1.2 mg/dL 0.5    ?Alkaline Phos 38 - 126 U/L 103    ?AST 15 - 41 U/L 17    ?ALT  0 - 44 U/L 14    ? ?Edinburgh Score: ? ?  06/24/2021  ?  9:07 AM  ?Flavia Shipper Postnatal Depression Scale Screening Tool  ?I have been able to laugh and see the funny side of things. 0  ?I have looked forward with enjoyment to things. 0  ?I have blamed myself unnecessarily when things went wrong. 0  ?I have been anxious or worried for no good reason. 0  ?I have felt scared or panicky for no good reason. 0  ?Things have been getting on top of me. 0  ?I have been so unhappy that I have had difficulty sleeping. 0  ?I have felt sad or miserable. 1  ?I have been so unhappy that I have been crying. 2  ?The thought of harming myself has occurred to me. 0  ?Edinburgh Postnatal Depression Scale Total 3  ? ? ? ?After visit meds:  ?Allergies as of 06/25/2021   ?No Known Allergies ?  ? ?  ?Medication List  ?  ? ?STOP taking these medications   ? ?aspirin EC 81 MG tablet ?  ? ?  ? ?TAKE these medications   ? ?acetaminophen 500 MG  tablet ?Commonly known as: TYLENOL ?Take 2 tablets (1,000 mg total) by mouth every 8 (eight) hours as needed (pain). ?  ?furosemide 20 MG tablet ?Commonly known as: Lasix ?Take 1 tablet (20 mg total) by mouth daily for 5 days. ?  ?ibuprofen 600 MG tablet ?Commonly known as: ADVIL ?Take 1 tablet (600 mg total) by mouth every 6 (six) hours as needed (pain). ?  ?prenatal vitamin w/FE, FA 27-1 MG Tabs tablet ?Take 1 tablet by mouth daily at 12 noon. ?  ? ?  ? ? ?Discharge home in stable condition ?Infant Feeding: Breast ?Infant Disposition: home with mother ?Discharge instruction: per After Visit Summary and Postpartum booklet. ?Activity: Advance as tolerated. Pelvic rest for 6 weeks.  ?Diet: routine diet ? ?Message sent to [facility] by Dr. Gwenlyn Perking on 06/25/21.  ? ?Please schedule patient for in-person postpartum visit in 6 weeks with any provider. ?Additional postpartum follow up: BP check in one week ?Delivery type: SVD ?Contraception: IUD outpatient  ? ?ANN Spanish interpreter [phone] used for entirety of encounter.  ? ?06/25/2021 ?Genia Del, MD ? ? ? ?

## 2021-06-24 LAB — CBC
HCT: 34.8 % — ABNORMAL LOW (ref 36.0–46.0)
Hemoglobin: 11.7 g/dL — ABNORMAL LOW (ref 12.0–15.0)
MCH: 28.8 pg (ref 26.0–34.0)
MCHC: 33.6 g/dL (ref 30.0–36.0)
MCV: 85.7 fL (ref 80.0–100.0)
Platelets: 183 10*3/uL (ref 150–400)
RBC: 4.06 MIL/uL (ref 3.87–5.11)
RDW: 14 % (ref 11.5–15.5)
WBC: 11.5 10*3/uL — ABNORMAL HIGH (ref 4.0–10.5)
nRBC: 0 % (ref 0.0–0.2)

## 2021-06-24 MED ORDER — MISOPROSTOL 200 MCG PO TABS
600.0000 ug | ORAL_TABLET | Freq: Once | ORAL | Status: AC
  Administered 2021-06-24: 600 ug via BUCCAL
  Filled 2021-06-24: qty 3

## 2021-06-24 MED ORDER — NIFEDIPINE ER OSMOTIC RELEASE 30 MG PO TB24
30.0000 mg | ORAL_TABLET | Freq: Every day | ORAL | Status: DC
Start: 2021-06-24 — End: 2021-06-24
  Filled 2021-06-24: qty 1

## 2021-06-24 NOTE — Anesthesia Postprocedure Evaluation (Signed)
Anesthesia Post Note ? ?Patient: Sabrina Riley ? ?Procedure(s) Performed: AN AD HOC LABOR EPIDURAL ? ?  ? ?Patient location during evaluation: Mother Baby ?Anesthesia Type: Epidural ?Level of consciousness: awake and alert ?Pain management: pain level controlled ?Vital Signs Assessment: post-procedure vital signs reviewed and stable ?Respiratory status: spontaneous breathing, nonlabored ventilation and respiratory function stable ?Cardiovascular status: stable ?Postop Assessment: no headache, no backache, epidural receding, no apparent nausea or vomiting, patient able to bend at knees, adequate PO intake and able to ambulate ?Anesthetic complications: no ? ? ?No notable events documented. ? ?Last Vitals:  ?Vitals:  ? 06/24/21 0142 06/24/21 0434  ?BP: 129/87 (!) 124/59  ?Pulse: 71 60  ?Resp: 18 18  ?Temp:  36.9 ?C  ?SpO2: 100% 99%  ?  ?Last Pain:  ?Vitals:  ? 06/24/21 0434  ?TempSrc: Oral  ?PainSc:   ? ?Pain Goal:   ? ?  ?  ?  ?  ?  ?  ?  ? ?[name] ? ? ? ? ?

## 2021-06-24 NOTE — Progress Notes (Signed)
Sabrina Riley, [id number] ?4 Hrs Post Vaginal Delivery ? ?Subjective: ?Nurse call reports increased vaginal bleeding with clots. Provider to bedside.  Patient not actively bleeding.  ? ?Objective: ?Vitals:  ? 06/23/21 2202 06/23/21 2310 06/24/21 0020 06/24/21 0142  ?BP: 130/66 132/68 132/61 129/87  ?Pulse: 80 (!) 108 73 71  ?Resp:  20 20 18   ?Temp:  98.2 ?F (36.8 ?C) 98.5 ?F (36.9 ?C)   ?TempSrc:  Oral Oral   ?SpO2:  100% 99% 100%  ?Weight:      ?Height:      ? ? ? ?Physical Exam: ?-General appearance - alert, well appearing, and in no distress ?-Mental Status - alert, oriented to person, place, and time ?-Chest - not examined  ?-Heart - normal rate, regular rhythm, normal S1, S2, no murmurs, rubs, clicks or gallops, not examined  ?-Abdomen -Soft, Appropriately Tender, Fundus at +1/U-Firm with massage ?-Breasts - breasts appear normal, no suspicious masses, no skin or nipple changes or axillary nodes, not examined  ?-Extremities - peripheral pulses normal, no pedal edema, no clubbing or cyanosis, not examined  ? ?Assessment ?4 Hrs Post Delivery ?Vaginal bleeding-Increased ? ?Plan: ?Exam of minimal concern ?Manual exam deferred ?Peripads reviewed. ?QBL at 322 with total overall BL of 522 ?Will give cytotec buccal. ?Plan to reassess prn  ? ? , MSN, CNM ?06/24/2021, 1:44 AM   ?

## 2021-06-24 NOTE — Progress Notes (Signed)
Called Dr Elliot Gurney regarding whether or not procardia dose today needed to be given due to current blood pressures. Dr. Elliot Gurney ordered to hold for today and will reassess further blood pressures for future doses. Sabrina Riley ? ?

## 2021-06-25 ENCOUNTER — Other Ambulatory Visit (HOSPITAL_COMMUNITY): Payer: Self-pay

## 2021-06-25 MED ORDER — IBUPROFEN 600 MG PO TABS
600.0000 mg | ORAL_TABLET | Freq: Four times a day (QID) | ORAL | 0 refills | Status: DC | PRN
Start: 2021-06-25 — End: 2023-11-19
  Filled 2021-06-25: qty 40, 10d supply, fill #0

## 2021-06-25 MED ORDER — ACETAMINOPHEN 500 MG PO TABS
1000.0000 mg | ORAL_TABLET | Freq: Three times a day (TID) | ORAL | 0 refills | Status: AC | PRN
  Filled 2021-06-25: qty 60, 10d supply, fill #0

## 2021-06-25 MED ORDER — FUROSEMIDE 20 MG PO TABS
20.0000 mg | ORAL_TABLET | Freq: Every day | ORAL | 0 refills | Status: DC
  Filled 2021-06-25: qty 5, 5d supply, fill #0

## 2021-07-02 ENCOUNTER — Ambulatory Visit (INDEPENDENT_AMBULATORY_CARE_PROVIDER_SITE_OTHER): Payer: Self-pay | Admitting: General Practice

## 2021-07-02 ENCOUNTER — Telehealth (HOSPITAL_COMMUNITY): Payer: Self-pay | Admitting: *Deleted

## 2021-07-02 VITALS — BP 131/66 | HR 72 | Ht 63.0 in | Wt 307.0 lb

## 2021-07-02 DIAGNOSIS — Z0130 Encounter for examination of blood pressure without abnormal findings: Secondary | ICD-10-CM

## 2021-07-02 NOTE — Telephone Encounter (Signed)
No call needed. Patient had OV appt earlier today for BP check. ? ?Duffy Rhody, [place] 07-02-2021 at 12:03pm ?

## 2021-07-02 NOTE — Progress Notes (Signed)
Patient presents to office today for blood pressure check following up from vaginal delivery on 4/29. Past medical hx is significant for [facility]. Patient completed Lasix course as prescribed. She denies headaches, dizziness or blurry vision. Reports normal blood pressure readings at home. BP in office today 131/66. Patient will follow up at pp visit on 6/12 or sooner if needed. ? ?Chase Caller RN BSN ?07/02/21 ? ?

## 2021-08-06 ENCOUNTER — Ambulatory Visit: Payer: Self-pay | Admitting: Medical

## 2021-08-30 ENCOUNTER — Ambulatory Visit: Payer: Self-pay | Admitting: Student

## 2021-08-30 ENCOUNTER — Encounter: Payer: Self-pay | Admitting: Student

## 2023-07-10 ENCOUNTER — Ambulatory Visit: Payer: Self-pay | Admitting: Nurse Practitioner

## 2023-09-07 ENCOUNTER — Encounter (HOSPITAL_COMMUNITY): Payer: Self-pay | Admitting: *Deleted

## 2023-09-07 ENCOUNTER — Ambulatory Visit (HOSPITAL_COMMUNITY)
Admission: EM | Admit: 2023-09-07 | Discharge: 2023-09-07 | Disposition: A | Discharge status: Home / Self Care | Payer: Self-pay | Attending: Emergency Medicine | Admitting: Emergency Medicine

## 2023-09-07 DIAGNOSIS — B9789 Other viral agents as the cause of diseases classified elsewhere: Secondary | ICD-10-CM

## 2023-09-07 DIAGNOSIS — J988 Other specified respiratory disorders: Secondary | ICD-10-CM

## 2023-09-07 LAB — POCT RAPID STREP A (OFFICE): Rapid Strep A Screen: NEGATIVE

## 2023-09-07 LAB — POC SARS CORONAVIRUS 2 AG -  ED: SARS Coronavirus 2 Ag: NEGATIVE

## 2023-09-07 MED ORDER — AZELASTINE HCL 0.1 % NA SOLN: 2.0000 | Freq: Two times a day (BID) | NASAL | 0 refills | Status: AC

## 2023-09-07 MED ORDER — BENZONATATE 100 MG PO CAPS: 100.0000 mg | ORAL_CAPSULE | Freq: Three times a day (TID) | ORAL | 0 refills | Status: DC

## 2023-09-07 MED ORDER — CETIRIZINE HCL 10 MG PO TABS: 10.0000 mg | ORAL_TABLET | Freq: Every day | ORAL | 0 refills | Status: AC

## 2023-09-07 NOTE — Discharge Instructions (Addendum)
 Your COVID testing and strep testing are both negative today.  As discussed I believe your symptoms are likely related to a viral respiratory illness. You can take Tessalon  every 8 hours as needed for cough. You can take cetirizine  daily to help with sore throat, congestion, and cough. You can use azelastine  nasal spray twice daily to help with congestion. Alternate between 650 mg of Tylenol  and 400 mg of ibuprofen  every 6-8 hours as needed for headache, sore throat, ear pain, or any fever. Make sure you are staying hydrated and getting plenty of rest. Follow-up with your primary care provider or return here as needed.  Tanto su prueba de COVID como su prueba de estreptococo son negativas hoy.  Como ya hemos comentado, creo que sus sntomas probablemente estn relacionados con una enfermedad respiratoria viral. Puede tomar Tessalon  cada 8 horas segn sea necesario para la tos. Puede tomar cetirizina diariamente para ayudar con el dolor de garganta, la congestin y la tos. Puede usar el aerosol nasal de [employer] veces al da para ayudar con la congestin. [place]terne entre 650 mg de Tylenol  y 400 mg de ibuprofeno cada 6 a 8 horas segn sea necesario para el dolor de cabeza, el dolor de garganta, el dolor de odo o la [place]. Asegrese de mantenerse hidratado y descansar lo suficiente. Haga un seguimiento con su proveedor de atencin primaria o regrese aqu segn sea necesario.

## 2023-09-07 NOTE — ED Triage Notes (Signed)
 Pt states she has sore throat and ear pain only on the right side X 2 days. She took advil  last night.

## 2023-09-07 NOTE — ED Provider Notes (Signed)
 [facility]    CSN: [id number] Arrival date & time: 09/07/23  1003      History   Chief Complaint Chief Complaint  Patient presents with   Sore Throat   Otalgia    HPI Sabrina Riley is a 28 y.o. female.   Patient presents with sore throat, right-sided ear pain, and mild headache that began yesterday.  Patient does endorse mild cough and congestion as well.  Patient states that she did have some chills last night as well.  Denies fever, body aches, shortness of breath, chest pain, nausea, vomiting, diarrhea, and abdominal pain.  Patient states that she has been taking Advil  with some relief.  Patient denies any known sick contacts.  The history is provided by the patient and medical records. The history is limited by a language barrier. A language interpreter was used (Spanish interpreter).  Sore Throat  Otalgia   Past Medical History:  Diagnosis Date   Asthma    History of gestational hypertension 10/14/2014   Hypertension    Labor and delivery indication for care or intervention 05/18/2018   Morbid obesity (HCC)    Morbid obesity (HCC) 05/18/2018    Patient Active Problem List   Diagnosis Date Noted   Chronic hypertension in pregnancy 06/23/2021   Vaginal delivery 06/23/2021   Chronic hypertension affecting pregnancy 12/13/2020   Supervision of high risk pregnancy, antepartum 12/04/2020   Maternal morbid obesity, antepartum (HCC) 12/04/2020    Past Surgical History:  Procedure Laterality Date   NO PAST SURGERIES      OB History     Gravida  3   Para  2   Term  2   Preterm      AB      Living  2      SAB      IAB      Ectopic      Multiple  0   Live Births  2            Home Medications    Prior to Admission medications   Medication Sig Start Date End Date Taking? Authorizing Provider  azelastine  (ASTELIN ) 0.1 % nasal spray Place 2 sprays into both nostrils 2 (two) times daily. Use in each nostril as directed  09/07/23  Yes Johnie Flaming A, NP  benzonatate  (TESSALON ) 100 MG capsule Take 1 capsule (100 mg total) by mouth every 8 (eight) hours. 09/07/23  Yes Johnie, Rhetta Cleek A, NP  cetirizine  (ZYRTEC  ALLERGY) 10 MG tablet Take 1 tablet (10 mg total) by mouth daily. 09/07/23  Yes Johnie, Ranyia Witting A, NP  acetaminophen  (TYLENOL ) 500 MG tablet Take 2 tablets (1,000 mg total) by mouth every 8 (eight) hours as needed (pain). 06/25/21   Clem Tawni HERO, MD  ibuprofen  (ADVIL ) 600 MG tablet Take 1 tablet (600 mg total) by mouth every 6 (six) hours as needed (pain). 06/25/21   Clem Tawni HERO, MD  prenatal vitamin w/FE, FA (PRENATAL 1 + 1) 27-1 MG TABS tablet Take 1 tablet by mouth daily at 12 noon. 12/13/20   Marylen Aleck HERO, CNM    Family History Family History  Problem Relation Age of Onset   Heart disease Father    Diabetes Father    Thyroid disease Sister     Social History Social History   Tobacco Use   Smoking status: Never    Passive exposure: Never   Smokeless tobacco: Never  Vaping Use   Vaping status: Never Used  Substance Use  Topics   Alcohol use: Never   Drug use: Never     Allergies   Patient has no known allergies.   Review of Systems Review of Systems  HENT:  Positive for ear pain.    Per HPI  Physical Exam Triage Vital Signs ED Triage Vitals  Encounter Vitals Group     BP 09/07/23 1023 113/81     Girls Systolic BP Percentile --      Girls Diastolic BP Percentile --      Boys Systolic BP Percentile --      Boys Diastolic BP Percentile --      Pulse Rate 09/07/23 1023 92     Resp 09/07/23 1023 18     Temp 09/07/23 1023 98.9 F (37.2 C)     Temp Source 09/07/23 1023 Oral     SpO2 09/07/23 1023 96 %     Weight --      Height --      Head Circumference --      Peak Flow --      Pain Score 09/07/23 1022 10     Pain Loc --      Pain Education --      Exclude from Growth Chart --    No data found.  Updated Vital Signs BP 113/81 (BP Location: Right Arm)    Pulse 92   Temp 98.9 F (37.2 C) (Oral)   Resp 18   LMP 09/04/2023 (Exact Date)   SpO2 96%   Visual Acuity Right Eye Distance:   Left Eye Distance:   Bilateral Distance:    Right Eye Near:   Left Eye Near:    Bilateral Near:     Physical Exam Vitals and nursing note reviewed.  Constitutional:      General: She is awake. She is not in acute distress.    Appearance: Normal appearance. She is well-developed and well-groomed. She is not ill-appearing.  HENT:     Right Ear: Ear canal and external ear normal. Tympanic membrane is erythematous. Tympanic membrane is not bulging.     Left Ear: Tympanic membrane, ear canal and external ear normal.     Nose: Congestion and rhinorrhea present.     Mouth/Throat:     Mouth: Mucous membranes are moist.     Pharynx: Posterior oropharyngeal erythema and postnasal drip present. No oropharyngeal exudate.  Cardiovascular:     Rate and Rhythm: Normal rate and regular rhythm.  Pulmonary:     Effort: Pulmonary effort is normal.     Breath sounds: Normal breath sounds.  Skin:    General: Skin is warm and dry.  Neurological:     Mental Status: She is alert.  Psychiatric:        Behavior: Behavior is cooperative.      UC Treatments / Results  Labs (all labs ordered are listed, but only abnormal results are displayed) Labs Reviewed  POCT RAPID STREP A (OFFICE) - Normal  POC SARS CORONAVIRUS 2 AG -  ED    EKG   Radiology No results found.  Procedures Procedures (including critical care time)  Medications Ordered in UC Medications - No data to display  Initial Impression / Assessment and Plan / UC Course  I have reviewed the triage vital signs and the nursing notes.  Pertinent labs & imaging results that were available during my care of the patient were reviewed by me and considered in my medical decision making (see chart for details).  Patient is overall well-appearing.  Vitals are stable.  Mild congestion and  rhinorrhea present, mild erythema and PND noted to pharynx.  Right TM is mildly erythematous as well.  COVID and strep testing both negative.  Deferred sending strep culture due to presentation being inconsistent with strep pharyngitis.  Symptoms likely viral in nature.  Prescribed Tessalon  as needed for cough.  Prescribed cetirizine  to help with sore throat, congestion, and cough.  Prescribed azelastine  nasal spray to help with congestion.  Recommended Tylenol  and ibuprofen  as needed for any additional symptoms.  Discussed follow-up and return precautions. Final Clinical Impressions(s) / UC Diagnoses   Final diagnoses:  Viral respiratory illness     Discharge Instructions      Your COVID testing and strep testing are both negative today.  As discussed I believe your symptoms are likely related to a viral respiratory illness. You can take Tessalon  every 8 hours as needed for cough. You can take cetirizine  daily to help with sore throat, congestion, and cough. You can use azelastine  nasal spray twice daily to help with congestion. Alternate between 650 mg of Tylenol  and 400 mg of ibuprofen  every 6-8 hours as needed for headache, sore throat, ear pain, or any fever. Make sure you are staying hydrated and getting plenty of rest. Follow-up with your primary care provider or return here as needed.  Tanto su prueba de COVID como su prueba de estreptococo son negativas hoy.  Como ya hemos comentado, creo que sus sntomas probablemente estn relacionados con una enfermedad respiratoria viral. Puede tomar Tessalon  cada 8 horas segn sea necesario para la tos. Puede tomar cetirizina diariamente para ayudar con el dolor de garganta, la congestin y la tos. Puede usar el aerosol nasal de [employer] veces al da para ayudar con la congestin. [place] entre 650 mg de Tylenol  y 400 mg de ibuprofeno cada 6 a 8 horas segn sea necesario para el dolor de cabeza, el dolor de garganta, el dolor de odo o  la [place]. Asegrese de mantenerse hidratado y descansar lo suficiente. Haga un seguimiento con su proveedor de atencin primaria o regrese aqu segn sea necesario.     ED Prescriptions     Medication Sig Dispense Auth. Provider   cetirizine  (ZYRTEC  ALLERGY) 10 MG tablet Take 1 tablet (10 mg total) by mouth daily. 30 tablet Johnie Flaming A, NP   azelastine  (ASTELIN ) 0.1 % nasal spray Place 2 sprays into both nostrils 2 (two) times daily. Use in each nostril as directed 30 mL Johnie Flaming A, NP   benzonatate  (TESSALON ) 100 MG capsule Take 1 capsule (100 mg total) by mouth every 8 (eight) hours. 21 capsule Johnie Flaming A, NP      PDMP not reviewed this encounter.   Johnie Flaming A, NP 09/07/23 1110

## 2023-10-11 ENCOUNTER — Encounter (HOSPITAL_COMMUNITY): Payer: Self-pay

## 2023-10-11 ENCOUNTER — Ambulatory Visit (HOSPITAL_COMMUNITY)
Admission: EM | Admit: 2023-10-11 | Discharge: 2023-10-11 | Disposition: A | Discharge status: Home / Self Care | Payer: Self-pay | Attending: Emergency Medicine | Admitting: Emergency Medicine

## 2023-10-11 DIAGNOSIS — J029 Acute pharyngitis, unspecified: Secondary | ICD-10-CM

## 2023-10-11 DIAGNOSIS — R21 Rash and other nonspecific skin eruption: Secondary | ICD-10-CM

## 2023-10-11 LAB — POC SARS CORONAVIRUS 2 AG -  ED: SARS Coronavirus 2 Ag: NEGATIVE

## 2023-10-11 LAB — POCT RAPID STREP A (OFFICE): Rapid Strep A Screen: NEGATIVE

## 2023-10-11 MED ORDER — PREDNISONE 20 MG PO TABS: 40.0000 mg | ORAL_TABLET | Freq: Every day | ORAL | 0 refills | Status: AC

## 2023-10-11 NOTE — ED Provider Notes (Signed)
 [facility]    CSN: [id number] Arrival date & time: 10/11/23  1021      History   Chief Complaint Chief Complaint  Patient presents with   Rash    HPI Sabrina Riley is a 28 y.o. female.   Patient presents with rash, sore throat, body aches, and fever that began on 8/12.  Patient states that the rash initially began on her arms and has now spread to her abdomen, back, and face.  Patient reports that the rash is itchy.  Patient states that the sore throat has worsened and she has difficulty swallowing due to this.  Patient also reports fatigue and low appetite.   Patient states that this morning she began having some mild cough and congestion as well.  Patient also reports that she has felt like she has had a fever at night over last few days.  Patient states that she has not taken her temperature.  Patient states that she was taking Benadryl  over the last few days without much relief.  Patient reports that her throat was seen yesterday was diagnosed with a throat infection and oral thrush.  Patient denies any mouth pain or sores.  The history is provided by the patient and medical records. The history is limited by a language barrier. A language interpreter was used (Spanish interpreter).  Rash   Past Medical History:  Diagnosis Date   Asthma    History of gestational hypertension 10/14/2014   Hypertension    Labor and delivery indication for care or intervention 05/18/2018   Morbid obesity (HCC)    Morbid obesity (HCC) 05/18/2018    Patient Active Problem List   Diagnosis Date Noted   Chronic hypertension in pregnancy 06/23/2021   Vaginal delivery 06/23/2021   Chronic hypertension affecting pregnancy 12/13/2020   Supervision of high risk pregnancy, antepartum 12/04/2020   Maternal morbid obesity, antepartum (HCC) 12/04/2020    Past Surgical History:  Procedure Laterality Date   NO PAST SURGERIES      OB History     Gravida  3   Para  2   Term   2   Preterm      AB      Living  2      SAB      IAB      Ectopic      Multiple  0   Live Births  2            Home Medications    Prior to Admission medications   Medication Sig Start Date End Date Taking? Authorizing Provider  predniSONE  (DELTASONE ) 20 MG tablet Take 2 tablets (40 mg total) by mouth daily for 3 days. 10/11/23 10/14/23 Yes Johnie Flaming A, NP  acetaminophen  (TYLENOL ) 500 MG tablet Take 2 tablets (1,000 mg total) by mouth every 8 (eight) hours as needed (pain). 06/25/21   Clem Tawni HERO, MD  azelastine  (ASTELIN ) 0.1 % nasal spray Place 2 sprays into both nostrils 2 (two) times daily. Use in each nostril as directed 09/07/23   Johnie Flaming A, NP  benzonatate  (TESSALON ) 100 MG capsule Take 1 capsule (100 mg total) by mouth every 8 (eight) hours. 09/07/23   Johnie Flaming A, NP  cetirizine  (ZYRTEC  ALLERGY) 10 MG tablet Take 1 tablet (10 mg total) by mouth daily. 09/07/23   Johnie Flaming A, NP  ibuprofen  (ADVIL ) 600 MG tablet Take 1 tablet (600 mg total) by mouth every 6 (six) hours as needed (pain). 06/25/21  Clem Tawni HERO, MD  prenatal vitamin w/FE, FA (PRENATAL 1 + 1) 27-1 MG TABS tablet Take 1 tablet by mouth daily at 12 noon. 12/13/20   Marylen Aleck HERO, CNM    Family History Family History  Problem Relation Age of Onset   Heart disease Father    Diabetes Father    Thyroid disease Sister     Social History Social History   Tobacco Use   Smoking status: Never    Passive exposure: Never   Smokeless tobacco: Never  Vaping Use   Vaping status: Never Used  Substance Use Topics   Alcohol use: Never   Drug use: Never     Allergies   Patient has no known allergies.   Review of Systems Review of Systems  Skin:  Positive for rash.   Per HPI  Physical Exam Triage Vital Signs ED Triage Vitals  Encounter Vitals Group     BP 10/11/23 1158 109/64     Girls Systolic BP Percentile --      Girls Diastolic BP Percentile --       Boys Systolic BP Percentile --      Boys Diastolic BP Percentile --      Pulse Rate 10/11/23 1158 94     Resp 10/11/23 1158 18     Temp 10/11/23 1158 97.9 F (36.6 C)     Temp Source 10/11/23 1158 Oral     SpO2 10/11/23 1158 96 %     Weight --      Height 10/11/23 1157 5' 3 (1.6 m)     Head Circumference --      Peak Flow --      Pain Score 10/11/23 1155 10     Pain Loc --      Pain Education --      Exclude from Growth Chart --    No data found.  Updated Vital Signs BP 109/64 (BP Location: Left Arm)   Pulse 94   Temp 97.9 F (36.6 C) (Oral)   Resp 18   Ht 5' 3 (1.6 m)   LMP 10/06/2023 (Approximate)   SpO2 96%   Breastfeeding No   BMI 54.38 kg/m   Visual Acuity Right Eye Distance:   Left Eye Distance:   Bilateral Distance:    Right Eye Near:   Left Eye Near:    Bilateral Near:     Physical Exam Vitals and nursing note reviewed.  Constitutional:      General: She is awake. She is not in acute distress.    Appearance: Normal appearance. She is well-developed and well-groomed. She is not ill-appearing.  HENT:     Right Ear: Tympanic membrane, ear canal and external ear normal.     Left Ear: Tympanic membrane, ear canal and external ear normal.     Nose: Congestion and rhinorrhea present.     Mouth/Throat:     Mouth: Mucous membranes are moist.     Pharynx: Posterior oropharyngeal erythema present. No oropharyngeal exudate.  Cardiovascular:     Rate and Rhythm: Normal rate and regular rhythm.  Pulmonary:     Effort: Pulmonary effort is normal.     Breath sounds: Normal breath sounds.  Skin:    General: Skin is warm and dry.     Findings: Rash present. Rash is macular.     Comments: Diffuse macular rash noted to bilateral arms, torso, and face.  Neurological:     Mental Status: She is alert.  Psychiatric:  Behavior: Behavior is cooperative.      UC Treatments / Results  Labs (all labs ordered are listed, but only abnormal results are  displayed) Labs Reviewed  POCT RAPID STREP A (OFFICE) - Normal  CULTURE, GROUP A STREP ([facility])  POC SARS CORONAVIRUS 2 AG -  ED    EKG   Radiology No results found.  Procedures Procedures (including critical care time)  Medications Ordered in UC Medications - No data to display  Initial Impression / Assessment and Plan / UC Course  I have reviewed the triage vital signs and the nursing notes.  Pertinent labs & imaging results that were available during my care of the patient were reviewed by me and considered in my medical decision making (see chart for details).     Patient is overall well-appearing.  Vitals are stable.  Congestion and rhinorrhea are present, erythema noted to posterior pharynx.  Without tonsillar edema or exudate present.  Lungs clear bilaterally to auscultation.  Diffuse macular rash noted to bilateral arms, torso, and face.  Rapid strep test was negative, will send culture to confirm this.  COVID testing was also negative.  I believe symptoms are likely viral in nature and rash could be attributed to this viral illness as well.  Prescribed short prednisone  burst for rash.  Recommended cetirizine  to help with rash and itching as well.  Discussed follow-up and return precautions. Final Clinical Impressions(s) / UC Diagnoses   Final diagnoses:  Sore throat  Viral pharyngitis  Rash     Discharge Instructions      Your rapid strep test was negative, we will send a culture of this to confirm presence of strep throat. Your COVID testing was also negative. I believe your symptoms are likely viral in nature.  Sometimes the rash can develop with this. Start taking 2 tablets of prednisone  once daily over the next 3 days to help with the rash. I also recommend taking over-the-counter cetirizine  (Zyrtec ) to help with rash and itching as well. Alternate between 650 mg of Tylenol  and 400 mg of ibuprofen  every 6-8 hours as needed for sore throat. Follow-up with your  primary care provider or return here as needed.  Su prueba rpida de estreptococos dio negativo; le enviaremos un cultivo para confirmar la presencia de Paramedic. Su prueba de COVID-19 tambin dio negativo. Creo que sus sntomas probablemente sean de origen viral. A veces, puede aparecer sarpullido. Comience a tomar 2 comprimidos de prednisona Building control surveyor al [employer] prximos 3 das para aliviar el sarpullido. Tambin recomiendo tomar cetirizina (Zyrtec ) sin receta para aliviar el sarpullido y la picazn. Alterne entre 650 mg de Tylenol  y 400 mg de ibuprofeno cada 6-8 horas segn sea necesario para el dolor de garganta. Consulte con su mdico de cabecera o regrese aqu cuando lo necesite.     ED Prescriptions     Medication Sig Dispense Auth. Provider   predniSONE  (DELTASONE ) 20 MG tablet Take 2 tablets (40 mg total) by mouth daily for 3 days. 6 tablet Johnie Flaming A, NP      PDMP not reviewed this encounter.   Johnie Flaming A, NP 10/11/23 1325

## 2023-10-11 NOTE — ED Triage Notes (Addendum)
 Interpreter: Lovina [id number]   Chief Complaint: body aches, headaches, rash, fatigue, and low appetite.   Sick exposure: Yes- Husband was seen yesterday for a throat infection and oral rash   Onset: this past Tuesday getting worse   Prescriptions or OTC medications tried: Yes- Benadryl     with little relief  New foods, medications, or products: No  Recent Travel: No

## 2023-10-11 NOTE — Discharge Instructions (Addendum)
 Your rapid strep test was negative, we will send a culture of this to confirm presence of strep throat. Your COVID testing was also negative. I believe your symptoms are likely viral in nature.  Sometimes the rash can develop with this. Start taking 2 tablets of prednisone  once daily over the next 3 days to help with the rash. I also recommend taking over-the-counter cetirizine  (Zyrtec ) to help with rash and itching as well. Alternate between 650 mg of Tylenol  and 400 mg of ibuprofen  every 6-8 hours as needed for sore throat. Follow-up with your primary care provider or return here as needed.  Su prueba rpida de estreptococos dio negativo; le enviaremos un cultivo para confirmar la presencia de Paramedic. Su prueba de COVID-19 tambin dio negativo. Creo que sus sntomas probablemente sean de origen viral. A veces, puede aparecer sarpullido. Comience a tomar 2 comprimidos de prednisona Building control surveyor al [employer] prximos 3 das para aliviar el sarpullido. Tambin recomiendo tomar cetirizina (Zyrtec ) sin receta para aliviar el sarpullido y la picazn. Alterne entre 650 mg de Tylenol  y 400 mg de ibuprofeno cada 6-8 horas segn sea necesario para el dolor de garganta. Consulte con su mdico de cabecera o regrese aqu cuando lo necesite.

## 2023-10-14 ENCOUNTER — Ambulatory Visit (HOSPITAL_COMMUNITY): Payer: Self-pay

## 2023-10-14 LAB — CULTURE, GROUP A STREP (THRC)

## 2023-11-19 ENCOUNTER — Encounter (HOSPITAL_COMMUNITY): Payer: Self-pay

## 2023-11-19 ENCOUNTER — Ambulatory Visit (HOSPITAL_COMMUNITY)
Admission: EM | Admit: 2023-11-19 | Discharge: 2023-11-19 | Disposition: A | Discharge status: Home / Self Care | Payer: Self-pay | Attending: Physician Assistant | Admitting: Physician Assistant

## 2023-11-19 DIAGNOSIS — L509 Urticaria, unspecified: Secondary | ICD-10-CM

## 2023-11-19 MED ORDER — PREDNISONE 20 MG PO TABS: 20.0000 mg | ORAL_TABLET | Freq: Two times a day (BID) | ORAL | 0 refills | Status: AC

## 2023-11-19 NOTE — ED Triage Notes (Signed)
 Patient here today with c/o rash on arms, face, and torso X 1 month. Patient also has some sores around her mouth. Patient took something that she received last month from here which helped but the rash returned a couple weeks ago.

## 2023-11-19 NOTE — Discharge Instructions (Addendum)
 Follow up with allergist.

## 2023-11-19 NOTE — ED Provider Notes (Signed)
 [facility]    CSN: [id number] Arrival date & time: 11/19/23  1811      History   Chief Complaint Chief Complaint  Patient presents with   Rash    HPI Sabrina Riley is a 28 y.o. female.   Patient here c/w rash x 2 weeks.  She had similar rash 1 month ago which resolved after prednisone  use.  Denies history of allergies.  No new foods, soaps, detergents.  No recent travel.  No new changes.    Past Medical History:  Diagnosis Date   Asthma    History of gestational hypertension 10/14/2014   Hypertension    Labor and delivery indication for care or intervention 05/18/2018   Morbid obesity (HCC)    Morbid obesity (HCC) 05/18/2018    Patient Active Problem List   Diagnosis Date Noted   Chronic hypertension in pregnancy 06/23/2021   Vaginal delivery 06/23/2021   Chronic hypertension affecting pregnancy 12/13/2020   Supervision of high risk pregnancy, antepartum 12/04/2020   Maternal morbid obesity, antepartum (HCC) 12/04/2020    Past Surgical History:  Procedure Laterality Date   NO PAST SURGERIES      OB History     Gravida  3   Para  2   Term  2   Preterm      AB      Living  2      SAB      IAB      Ectopic      Multiple  0   Live Births  2            Home Medications    Prior to Admission medications   Medication Sig Start Date End Date Taking? Authorizing Provider  predniSONE  (DELTASONE ) 20 MG tablet Take 1 tablet (20 mg total) by mouth 2 (two) times daily with a meal. 11/19/23  Yes Juleen Rush, PA-C  acetaminophen  (TYLENOL ) 500 MG tablet Take 2 tablets (1,000 mg total) by mouth every 8 (eight) hours as needed (pain). 06/25/21   Clem Tawni HERO, MD  azelastine  (ASTELIN ) 0.1 % nasal spray Place 2 sprays into both nostrils 2 (two) times daily. Use in each nostril as directed 09/07/23   Johnie Flaming A, NP  cetirizine  (ZYRTEC  ALLERGY) 10 MG tablet Take 1 tablet (10 mg total) by mouth daily. 09/07/23   Johnie Flaming LABOR, NP    Family History Family History  Problem Relation Age of Onset   Heart disease Father    Diabetes Father    Thyroid disease Sister     Social History Social History   Tobacco Use   Smoking status: Never    Passive exposure: Never   Smokeless tobacco: Never  Vaping Use   Vaping status: Never Used  Substance Use Topics   Alcohol use: Never   Drug use: Never     Allergies   Patient has no known allergies.   Review of Systems Review of Systems  Constitutional:  Negative for chills, fatigue and fever.  HENT:  Negative for congestion, ear pain, facial swelling, nosebleeds, postnasal drip, rhinorrhea, sinus pressure, sinus pain, sore throat and trouble swallowing.   Eyes:  Negative for pain and redness.  Respiratory:  Negative for cough, shortness of breath and wheezing.   Gastrointestinal:  Negative for abdominal pain, diarrhea, nausea and vomiting.  Musculoskeletal:  Negative for arthralgias and myalgias.  Skin:  Positive for rash.  Neurological:  Negative for light-headedness and headaches.  Hematological:  Negative for  adenopathy. Does not bruise/bleed easily.  Psychiatric/Behavioral:  Negative for confusion and sleep disturbance.      Physical Exam Triage Vital Signs ED Triage Vitals  Encounter Vitals Group     BP 11/19/23 1904 120/77     Girls Systolic BP Percentile --      Girls Diastolic BP Percentile --      Boys Systolic BP Percentile --      Boys Diastolic BP Percentile --      Pulse Rate 11/19/23 1904 80     Resp 11/19/23 1904 16     Temp 11/19/23 1904 98 F (36.7 C)     Temp Source 11/19/23 1904 Oral     SpO2 11/19/23 1904 98 %     Weight --      Height --      Head Circumference --      Peak Flow --      Pain Score 11/19/23 1906 8     Pain Loc --      Pain Education --      Exclude from Growth Chart --    No data found.  Updated Vital Signs BP 120/77 (BP Location: Right Arm)   Pulse 80   Temp 98 F (36.7 C) (Oral)   Resp  16   LMP 11/02/2023 (Approximate)   SpO2 98%   Visual Acuity Right Eye Distance:   Left Eye Distance:   Bilateral Distance:    Right Eye Near:   Left Eye Near:    Bilateral Near:     Physical Exam Vitals and nursing note reviewed.  Constitutional:      General: She is not in acute distress.    Appearance: Normal appearance. She is not ill-appearing.  HENT:     Head: Normocephalic and atraumatic.  Eyes:     General: No scleral icterus.    Extraocular Movements: Extraocular movements intact.     Conjunctiva/sclera: Conjunctivae normal.  Pulmonary:     Effort: Pulmonary effort is normal. No respiratory distress.  Musculoskeletal:        General: Normal range of motion.     Cervical back: Normal range of motion. No rigidity.  Skin:    General: Skin is warm.     Coloration: Skin is not jaundiced.     Findings: No rash.     Comments: Diffuse urticarial lesions across face, hands, arms, chest, abdomen, legs.  Will blanch.  Neurological:     General: No focal deficit present.     Mental Status: She is alert and oriented to person, place, and time.     Motor: No weakness.     Gait: Gait normal.  Psychiatric:        Mood and Affect: Mood normal.        Behavior: Behavior normal.      UC Treatments / Results  Labs (all labs ordered are listed, but only abnormal results are displayed) Labs Reviewed - No data to display  EKG   Radiology No results found.  Procedures Procedures (including critical care time)  Medications Ordered in UC Medications - No data to display  Initial Impression / Assessment and Plan / UC Course  I have reviewed the triage vital signs and the nursing notes.  Pertinent labs & imaging results that were available during my care of the patient were reviewed by me and considered in my medical decision making (see chart for details).     Take Zyrtec  daily Take medication as prescribed Follow  up with allergist Final Clinical Impressions(s)  / UC Diagnoses   Final diagnoses:  Urticaria     Discharge Instructions      Follow up with allergist    ED Prescriptions     Medication Sig Dispense Auth. Provider   predniSONE  (DELTASONE ) 20 MG tablet Take 1 tablet (20 mg total) by mouth 2 (two) times daily with a meal. 10 tablet Juleen Rush, PA-C      PDMP not reviewed this encounter.   Juleen Rush, PA-C 11/19/23 2024

## 2023-11-21 ENCOUNTER — Encounter: Payer: Self-pay | Admitting: Internal Medicine

## 2023-11-21 ENCOUNTER — Ambulatory Visit (INDEPENDENT_AMBULATORY_CARE_PROVIDER_SITE_OTHER): Payer: Self-pay | Admitting: Internal Medicine

## 2023-11-21 VITALS — BP 132/88 | HR 78 | Temp 98.5°F | Resp 20 | Ht 63.5 in | Wt 310.0 lb

## 2023-11-21 DIAGNOSIS — L508 Other urticaria: Secondary | ICD-10-CM

## 2023-11-21 MED ORDER — METHYLPREDNISOLONE ACETATE 40 MG/ML IJ SUSP
40.0000 mg | Freq: Once | INTRAMUSCULAR | Status: AC
Start: 2023-11-21 — End: 2023-11-21
  Administered 2023-11-21: 40 mg via INTRAMUSCULAR

## 2023-11-21 NOTE — Progress Notes (Signed)
 NEW PATIENT Date of Service/Encounter:   11/21/2023 Referring provider: none-self referred Primary care provider: Patient, No Pcp Per  Subjective:  Sabrina Riley is a 28 y.o. female presenting today for evaluation of urticaria. History obtained from: chart review and patient.   Discussed the use of AI scribe software for clinical note transcription with the patient, who gave verbal consent to proceed.  History of Present Illness Sabrina Riley is a 28 year old female who presents with chronic urticaria.  Cutaneous eruptions (urticaria) - Onset one month ago with persistent red, raised welts on the skin - Lesions primarily located on hands, face, and neck; recent new lesions on hands and neck - Welts do not migrate or change location that she can tell, but so widespread she is unsure - Lesions cause burning sensation upon touch and mild pruritus - No response to over-the-counter or prescribed antihistamines alone but only taking one zyrtec  - No identified triggers including illness, new exposures, or significant life changes - No recent fever, joint pain, weight loss, respiratory, or gastrointestinal symptoms - denies any systemic symptoms  Therapeutic response and medication use - Initial emergency department treatment with Zyrtec  and prednisone  provided partial relief - Recurrence of symptoms after discontinuation of initial therapy - Recent emergency department visit with repeat prescription of Zyrtec  and prednisone  - Currently taking prednisone  20 mg twice daily for five days and one Zyrtec  daily - Persistent symptoms despite ongoing therapy  Atopic and allergic history - History of eczema on arms, not recently flared in several months - Known pollen allergy with facial swelling after playing in thick pollen  - No recent exacerbation of eczema or environmental allergies  Environmental and exposure history - Has a small dog for four years without previous  allergic issues - Recent move to a new house, but symptom onset preceded the move - No new exposures identified  Relevant family and personal medical history - Family history of thyroid disease in sister and grandmother - No personal history of thyroid disease or other chronic conditions - Recent blood tests for cholesterol and other parameters including blood cell counts were repotedly normal but not available for my review  Chart Review:  Se[facility] at [facility] 11/19/23: rash present x 1 month, instructed to take zyrtec  daily and follow-up with allergy  Past Medical History: Past Medical History:  Diagnosis Date   Asthma    History of gestational hypertension 10/14/2014   Hypertension    Labor and delivery indication for care or intervention 05/18/2018   Morbid obesity (HCC)    Morbid obesity (HCC) 05/18/2018   Medication List:  Current Outpatient Medications  Medication Sig Dispense Refill   cetirizine  (ZYRTEC  ALLERGY) 10 MG tablet Take 1 tablet (10 mg total) by mouth daily. 30 tablet 0   acetaminophen  (TYLENOL ) 500 MG tablet Take 2 tablets (1,000 mg total) by mouth every 8 (eight) hours as needed (pain). 60 tablet 0   azelastine  (ASTELIN ) 0.1 % nasal spray Place 2 sprays into both nostrils 2 (two) times daily. Use in each nostril as directed 30 mL 0   predniSONE  (DELTASONE ) 20 MG tablet Take 1 tablet (20 mg total) by mouth 2 (two) times daily with a meal. 10 tablet 0   No current facility-administered medications for this visit.   Known Allergies:  No Known Allergies Past Surgical History: Past Surgical History:  Procedure Laterality Date   NO PAST SURGERIES     Family History: Family History  Problem Relation Age of Onset  Heart disease Father    Diabetes Father    Thyroid disease Sister    Asthma Other    Social History: Sabrina Riley lives in a house built in 2023, vinyl floors, Contractor, central AC, indoor dog, no roaches, not using dust mite covers on the bed of the pillows,  no smoke exposure.  + HEPA filter in the home.  [place]..   ROS:  All other systems negative except as noted per HPI.  Objective:  Blood pressure 132/88, pulse 78, temperature 98.5 F (36.9 C), temperature source Temporal, resp. rate 20, height 5' 3.5 (1.613 m), weight (!) 310 lb (140.6 kg), last menstrual period 11/02/2023, SpO2 97%. Body mass index is 54.05 kg/m. Physical Exam:  General Appearance:  Alert, cooperative, no distress, appears stated age  Head:  Normocephalic, without obvious abnormality, atraumatic  Eyes:  Conjunctiva clear, EOM's intact  Ears EACs normal bilaterally and normal TMs bilaterally  Nose: Nares normal, hypertrophic turbinates, normal mucosa, and no visible anterior polyps  Throat: Lips, tongue normal; teeth and gums normal, normal posterior oropharynx  Neck: Supple, symmetrical  Lungs:   clear to auscultation bilaterally, Respirations unlabored, no coughing  Heart:  regular rate and rhythm and no murmur, Appears well perfused  Extremities: No edema  Skin: Reviewed exposed areas of skin which showed Erythematous circular macules some slightly raised and edematous scattered over bilateral upper extremities upper chest and face, no bruising  Neurologic: No gross deficits   Diagnostics:   Labs:  Lab Orders  No laboratory test(s) ordered today     Assessment and Plan  Chronic Idiopathic Urticaria: Assessment and Plan Chronic urticaria with persistent erythematous wheals on hands, face, and neck, exacerbated by sun exposure. Symptoms persist despite Zyrtec  and prednisone . 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 Other differential diagnosis include drug allergy but not taking anything prior to initiation of symptoms vs urticarial vasculitis, though vasculitis is less likely due to absence of bruising and systemic symptoms.  - this is defined as hives lasting more than 6 weeks without an identifiable trigger - hives can  be from a number of different sources including infections, allergies, vibration, temperature, pressure among many others other possible causes - often an identifiable cause is not determined - some potential triggers include: stress, illness, NSAIDs, aspirin , hormonal changes - you do not have any red flag symptoms to make us  concerned about secondary causes of hives, reports recent normal blood counts - approximately 50% of patients with chronic hives can have some associated swelling of the face/lips/eyelids (this is not a cause for alarm and does not typically progress onto systemic allergic reactions)  Therapy Plan:  - start daily non-sedating antihistamine once daily Your options include: Zyrtec  (cetirizine ) 10 mg, Claritin (loratadine) 10 mg, Xyzal (levocetirizine) 5 mg or Allegra (fexofenadine) 180 mg daily as needed - if hives are uncontrolled, increase antihistamine to 1 tablet twice daily - if hives remain uncontrolled, increase dose of antihistamine to 2 tablets twice daily- this is maximum dose - can increase or decrease dosing depending on symptom control to a maximum dose of 4 tablets of antihistamine daily. Wait until hives free for at least one month prior to decreasing dose.   - if hives are still uncontrolled with the above regimen, please arrange an appointment for discussion of Xolair (omalizumab)- an injectable medication for hives or cyclosporine (a tablet for hives). Cyclosporine may be a more affordable option if without insurance.  Can use one of the following in place  of zyrtec  if desires: Claritin (loratadine) 10 mg, Xyzal (levocetirizine) 5 mg or Allegra (fexofenadine) 180 mg daily as needed  Let us  know if not controlled to discuss other treatment options.     This note in its entirety was forwarded to the Provider who requested this consultation.  Other: 40 mg IM depo given in clinic today    Sincerely,  Rocky Endow, MD [facility]

## 2023-11-21 NOTE — Patient Instructions (Signed)
 Chronic Idiopathic Urticaria: - this is defined as hives lasting more than 6 weeks without an identifiable trigger - hives can be from a number of different sources including infections, allergies, vibration, temperature, pressure among many others other possible causes - often an identifiable cause is not determined - some potential triggers include: stress, illness, NSAIDs, aspirin , hormonal changes - you do not have any red flag symptoms to make us  concerned about secondary causes of hives, reports recent normal blood counts - approximately 50% of patients with chronic hives can have some associated swelling of the face/lips/eyelids (this is not a cause for alarm and does not typically progress onto systemic allergic reactions)  Therapy Plan:  - start daily non-sedating antihistamine once daily Your options include: Zyrtec  (cetirizine ) 10 mg, Claritin (loratadine) 10 mg, Xyzal (levocetirizine) 5 mg or Allegra (fexofenadine) 180 mg daily as needed - if hives are uncontrolled, increase antihistamine to 1 tablet twice daily - if hives remain uncontrolled, increase dose of antihistamine to 2 tablets twice daily- this is maximum dose - can increase or decrease dosing depending on symptom control to a maximum dose of 4 tablets of antihistamine daily. Wait until hives free for at least one month prior to decreasing dose.   - if hives are still uncontrolled with the above regimen, please arrange an appointment for discussion of Xolair (omalizumab)- an injectable medication for hives or cyclosporine (a tablet for hives). Cyclosporine may be a more affordable option if without insurance.  Can use one of the following in place of zyrtec  if desires: Claritin (loratadine) 10 mg, Xyzal (levocetirizine) 5 mg or Allegra (fexofenadine) 180 mg daily as needed

## 2023-12-01 ENCOUNTER — Encounter: Payer: Self-pay | Admitting: Internal Medicine

## 2023-12-01 NOTE — Telephone Encounter (Signed)
 Needs Spanish interpreter. Please call her back and let her know:  It is abnormal to have severe pain from swallowing due to allergies. Recommend getting evaluated for strep throat or other infections.  Avoid acidic foods like tomato if throat burning on eating. Is she taking pepcid 20 mg BID? If so, can add omeprazole 40 mg daily x 6 weeks. Take on empty stomach.  For hives, continue taking zyrtec  or similar antihistamines up to 2 tablets twice daily.  Return for follow-up in 4-6 weeks, sooner if needed.

## 2023-12-01 NOTE — Telephone Encounter (Signed)
 Spoke to patient she states using zyrtec . Face swelling and inflammation gone. Some arm hives returned today. She has some mouth and throat pain when she eats and swallows salsa x 1 week. Please advice. Increase meds? Change meds? Follow up visit?

## 2023-12-02 NOTE — Telephone Encounter (Signed)
 Spoke to patient reviewd info she will call pcp to rule out streph throat he had that about 6-7 weeks ago but resolved after medication. She will continue with zyrtec  2 tabs bid and start pepcid 20mg  bid OTC she does not want omeprazole until she tries pepcid for a few weeks . No other questions or concerns at this timw.

## 2024-01-29 ENCOUNTER — Ambulatory Visit: Payer: Self-pay | Admitting: Internal Medicine

## 2024-01-29 ENCOUNTER — Encounter: Payer: Self-pay | Admitting: Internal Medicine

## 2024-01-29 VITALS — BP 122/68 | HR 76 | Temp 97.3°F | Resp 20 | Wt 302.6 lb

## 2024-01-29 DIAGNOSIS — R21 Rash and other nonspecific skin eruption: Secondary | ICD-10-CM | POA: Insufficient documentation

## 2024-01-29 MED ORDER — HYDROCORTISONE 2.5 % EX OINT: TOPICAL_OINTMENT | CUTANEOUS | 3 refills | Status: AC

## 2024-01-29 MED ORDER — TRIAMCINOLONE ACETONIDE 0.1 % EX OINT: TOPICAL_OINTMENT | CUTANEOUS | 1 refills | Status: AC

## 2024-01-29 NOTE — Progress Notes (Signed)
 FOLLOW UP Date of Service/Encounter:   01/29/2024  Subjective:  Sabrina Riley (DOB: 1995/07/16) is a 28 y.o. female who [facility] in re-evaluation of the following: rash History obtained from: chart review and patient.  For Review, LV was on 11/21/23  with Dr.Rameses Ou seen for intial visit for rash. See below for summary of history and diagnostics.   Therapeutic plans/changes recommended: at her initial visit, concerned for  ----------------------------------------------------- Pertinent History/Diagnostics:  Rash Chronic urticaria with persistent erythematous wheals on hands, face, and neck, exacerbated by sun exposure. Symptoms persist despite Zyrtec  and prednisone . Suspected chronic spontaneous urticaria based on wheal appearance but timeline of individual lesions unclear, not itchy. Responsive to prednisone , unclear response to zyrtec . Other differential diagnosis include drug allergy but not taking anything prior to initiation of symptoms vs urticarial vasculitis, though vasculitis is less likely due to absence of bruising and systemic symptoms.  --------------------------------------------------- Today presents for follow-up. Discussed the use of AI scribe software for clinical note transcription with the patient, who gave verbal consent to proceed.  History of Present Illness Sabrina Riley is a 28 year old female who presents with persistent skin rashes and swelling. She was referred by her primary care doctor, Dr. Lonell, for evaluation of persistent skin rashes.  Cutaneous eruptions - Persistent erythematous rashes primarily on arms and face since August - Rashes become more erythematous and swollen after sun exposure, do not move around, do not itchy - Lesions remain in original locations but can fluctuate in redness and flare up again - No pruritus, pain, or discomfort associated with the rashes - No history of  eczema  Facial swelling - Facial swelling occurs in association with the rashes - No joint pain or swelling elsewhere  Response to medications - Zyrtec  taken up to four times daily without symptom relief; possible worsening of redness - Two-week trial of a medication similar to one previously used in [place] led to slight improvement over a six-day course  Laboratory and metabolic findings - Negative blood tests for lupus, thyroid dysfunction, and other conditions - Low vitamin D levels - Slightly elevated cholesterol, attributed to a ketogenic diet   All medications reviewed by clinical staff and updated in chart. No new pertinent medical or surgical history except as noted in HPI.  ROS: All others negative except as noted per HPI.   Objective:  BP 122/68   Pulse 76   Temp (!) 97.3 F (36.3 C) (Temporal)   Resp 20   Wt (!) 302 lb 9.6 oz (137.3 kg)   SpO2 98%   BMI 52.76 kg/m  Body mass index is 52.76 kg/m. Physical Exam: General Appearance:  Alert, cooperative, no distress, appears stated age  Head:  Normocephalic, without obvious abnormality, atraumatic  Eyes:  Conjunctiva clear, EOM's intact  Nose: Nares normal, no rhinorrhea  Throat: Lips, tongue normal; teeth and gums normal, moist mucus membranes  Neck: Supple, symmetrical  Lungs:   clear to auscultation bilaterally, Respirations unlabored, no coughing  Heart:  regular rate and rhythm and no murmur, Appears well perfused  Extremities: No edema  Skin: Erythematous raised plaques covering face, upper extremities, erythematous macular rashes covering abdomen and lower extermities, no bruising or excoriations, no scaling  Neurologic: No gross deficits   Labs:  Lab Orders  No laboratory test(s) ordered today    Assessment/Plan   Assessment and Plan Assessment & Plan Chronic, non-pruritic, recurrent skin rash involving entire body, not  Chronic skin rash  on arms and face since August, non-pruritic, recurrent,  and not responsive to Zyrtec . Rash does not move like urticaria and is not itchy would be atypical for eczema or contact dermatitis. No scaling but did consider plaque psoriasis. Symptoms not suggestive of food or environmental allergy testing so would not recommend skin testing for these as this would be very low yield. Previous prednisone  treatment provided temporary relief. No systemic symptoms such as joint pain or swelling. Further evaluation by dermatology is necessary to determine the exact diagnosis and appropriate treatment. - Referred to dermatology for further evaluation and possible biopsy. - Prescribed topical steroid cream: hydrocortisone for face and triamcinolone for body to see if effective. - Advised to use topical steroids and discontinue if no improvement. - Will discuss potential for allergy testing after dermatology evaluation if indicated.  Follow up : as needed It was a pleasure seeing you again in clinic today! Thank you for allowing me to participate in your care.  Rocky Endow, MD [facility]     Other: none  Rocky Endow, MD  [facility] 

## 2024-02-25 ENCOUNTER — Telehealth: Payer: Self-pay | Admitting: Internal Medicine

## 2024-02-25 NOTE — Telephone Encounter (Signed)
 [facility] closed out Sabrina Riley's referral due to patient refused service
